# Patient Record
Sex: Male | Born: 1955 | Race: White | Hispanic: No | Marital: Married | Smoking: Never smoker
Health system: Southern US, Community
[De-identification: ages and names within clinical notes are randomized; demographics above are authoritative.]

## PROBLEM LIST (undated history)

## (undated) DIAGNOSIS — M19012 Primary osteoarthritis, left shoulder: Secondary | ICD-10-CM

## (undated) DIAGNOSIS — R972 Elevated prostate specific antigen [PSA]: Secondary | ICD-10-CM

## (undated) DIAGNOSIS — R112 Nausea with vomiting, unspecified: Secondary | ICD-10-CM

## (undated) DIAGNOSIS — Z9889 Other specified postprocedural states: Secondary | ICD-10-CM

## (undated) DIAGNOSIS — E785 Hyperlipidemia, unspecified: Secondary | ICD-10-CM

## (undated) DIAGNOSIS — C61 Malignant neoplasm of prostate: Secondary | ICD-10-CM

## (undated) DIAGNOSIS — N400 Enlarged prostate without lower urinary tract symptoms: Secondary | ICD-10-CM

## (undated) DIAGNOSIS — K219 Gastro-esophageal reflux disease without esophagitis: Secondary | ICD-10-CM

## (undated) DIAGNOSIS — M1990 Unspecified osteoarthritis, unspecified site: Secondary | ICD-10-CM

## (undated) HISTORY — PX: APPENDECTOMY: SHX54

## (undated) HISTORY — PX: HERNIA REPAIR: SHX51

## (undated) HISTORY — PX: COLONOSCOPY: SHX174

## (undated) HISTORY — PX: TONSILLECTOMY: SUR1361

---

## 2005-11-15 ENCOUNTER — Ambulatory Visit: Payer: Self-pay | Admitting: Physician Assistant

## 2005-12-14 ENCOUNTER — Ambulatory Visit: Payer: Self-pay | Admitting: Orthopaedic Surgery

## 2008-11-14 ENCOUNTER — Ambulatory Visit: Payer: Self-pay | Admitting: Internal Medicine

## 2009-05-04 ENCOUNTER — Ambulatory Visit: Payer: Self-pay | Admitting: Internal Medicine

## 2010-05-16 ENCOUNTER — Ambulatory Visit: Payer: Self-pay | Admitting: Family Medicine

## 2013-04-06 ENCOUNTER — Encounter (HOSPITAL_COMMUNITY): Payer: Self-pay | Admitting: Pharmacy Technician

## 2013-04-10 ENCOUNTER — Encounter (HOSPITAL_COMMUNITY): Payer: Self-pay

## 2013-04-10 ENCOUNTER — Other Ambulatory Visit (HOSPITAL_COMMUNITY): Payer: Self-pay | Admitting: *Deleted

## 2013-04-10 ENCOUNTER — Encounter (HOSPITAL_COMMUNITY)
Admission: RE | Admit: 2013-04-10 | Discharge: 2013-04-10 | Disposition: A | Discharge status: Home / Self Care | Payer: BC Managed Care – PPO | Source: Ambulatory Visit | Attending: Orthopedic Surgery | Admitting: Orthopedic Surgery

## 2013-04-10 DIAGNOSIS — Z01812 Encounter for preprocedural laboratory examination: Secondary | ICD-10-CM | POA: Insufficient documentation

## 2013-04-10 DIAGNOSIS — Z01818 Encounter for other preprocedural examination: Secondary | ICD-10-CM | POA: Insufficient documentation

## 2013-04-10 HISTORY — DX: Unspecified osteoarthritis, unspecified site: M19.90

## 2013-04-10 HISTORY — DX: Other specified postprocedural states: Z98.890

## 2013-04-10 HISTORY — DX: Other specified postprocedural states: R11.2

## 2013-04-10 LAB — ABO/RH: ABO/RH(D): O NEG

## 2013-04-10 LAB — BASIC METABOLIC PANEL
CO2: 27 mEq/L (ref 19–32)
Calcium: 9.5 mg/dL (ref 8.4–10.5)
GFR calc Af Amer: >90 mL/min (ref >90)
GFR calc non Af Amer: >90 mL/min (ref >90)
Glucose, Bld: 108 mg/dL — ABNORMAL HIGH (ref 70–99)
Potassium: 4.1 mEq/L (ref 3.5–5.1)
Sodium: 141 mEq/L (ref 135–145)

## 2013-04-10 LAB — CBC
Hemoglobin: 14.5 g/dL (ref 13.0–17.0)
MCH: 31.9 pg (ref 26.0–34.0)
MCHC: 34.9 g/dL (ref 30.0–36.0)
MCV: 91.4 fL (ref 78.0–100.0)
Platelets: 224 10*3/uL (ref 150–400)
RBC: 4.55 MIL/uL (ref 4.22–5.81)

## 2013-04-10 LAB — TYPE AND SCREEN
ABO/RH(D): O NEG
Antibody Screen: NEGATIVE

## 2013-04-10 NOTE — Progress Notes (Signed)
Left message at Dr Shelba Flake office that orders were needed for PAT visit for surgery.

## 2013-04-10 NOTE — Pre-Procedure Instructions (Signed)
Valdis Bevill  04/10/2013   Your procedure is scheduled on:  Tuesday, April 21, 2013 at 7:30 AM.   Report to [facility] Entrance "A" at 5:30 AM.   Call this number if you have problems the morning of surgery: [phone]   Remember:   Do not eat food or drink liquids after midnight Monday, 04/20/13.   Take these medicines the morning of surgery with A SIP OF WATER:  traMADol (ULTRAM) - if needed.  Stop all Vitamins and NSAIDS (Naproxen) as of 04/14/13.     Do not wear jewelry.  Do not wear lotions, powders, or cologne. You may wear deodorant.             Men may shave face and neck.  Do not bring valuables to the hospital.  [facility] is not responsible for any belongings or valuables.               Contacts, dentures or bridgework may not be worn into surgery.  Leave suitcase in the car. After surgery it may be brought to your room.  For patients admitted to the hospital, discharge time is determined by your treatment team.               Special Instructions: Shower using CHG 2 nights before surgery and the night before surgery.  If you shower the day of surgery use CHG.  Use special wash - you have one bottle of CHG for all showers.  You should use approximately 1/3 of the bottle for each shower.   Please read over the following fact sheets that you were given: Pain Booklet, Coughing and Deep Breathing, Blood Transfusion Information and Surgical Site Infection Prevention

## 2013-04-21 ENCOUNTER — Ambulatory Visit (HOSPITAL_COMMUNITY)
Admission: RE | Admit: 2013-04-21 | Discharge: 2013-04-21 | DRG: 483 | Disposition: A | Discharge status: Home / Self Care | Payer: BC Managed Care – PPO | Source: Ambulatory Visit | Attending: Orthopedic Surgery | Admitting: Orthopedic Surgery

## 2013-04-21 ENCOUNTER — Encounter (HOSPITAL_COMMUNITY): Payer: BC Managed Care – PPO | Admitting: Anesthesiology

## 2013-04-21 ENCOUNTER — Encounter (HOSPITAL_COMMUNITY): Payer: Self-pay | Admitting: *Deleted

## 2013-04-21 ENCOUNTER — Inpatient Hospital Stay (HOSPITAL_COMMUNITY): Payer: BC Managed Care – PPO

## 2013-04-21 ENCOUNTER — Encounter (HOSPITAL_COMMUNITY)
Admission: RE | Disposition: A | Discharge status: Home / Self Care | Payer: Self-pay | Source: Ambulatory Visit | Attending: Orthopedic Surgery

## 2013-04-21 ENCOUNTER — Inpatient Hospital Stay (HOSPITAL_COMMUNITY): Payer: BC Managed Care – PPO | Admitting: Anesthesiology

## 2013-04-21 DIAGNOSIS — M19219 Secondary osteoarthritis, unspecified shoulder: Secondary | ICD-10-CM | POA: Insufficient documentation

## 2013-04-21 DIAGNOSIS — M19012 Primary osteoarthritis, left shoulder: Secondary | ICD-10-CM

## 2013-04-21 DIAGNOSIS — M19011 Primary osteoarthritis, right shoulder: Secondary | ICD-10-CM | POA: Diagnosis present

## 2013-04-21 HISTORY — DX: Primary osteoarthritis, left shoulder: M19.012

## 2013-04-21 HISTORY — PX: SHOULDER HEMI-ARTHROPLASTY: SHX5049

## 2013-04-21 LAB — CBC
HCT: 40.9 % (ref 39.0–52.0)
Hemoglobin: 13.7 g/dL (ref 13.0–17.0)
MCHC: 33.5 g/dL (ref 30.0–36.0)
MCV: 91.9 fL (ref 78.0–100.0)
Platelets: 249 10*3/uL (ref 150–400)
WBC: 14.8 10*3/uL — ABNORMAL HIGH (ref 4.0–10.5)

## 2013-04-21 LAB — CREATININE, SERUM
Creatinine, Ser: 0.79 mg/dL (ref 0.50–1.35)
GFR calc Af Amer: >90 mL/min (ref >90)
GFR calc non Af Amer: >90 mL/min (ref >90)

## 2013-04-21 SURGERY — HEMIARTHROPLASTY, SHOULDER
Anesthesia: Regional | Site: Shoulder | Laterality: Left

## 2013-04-21 MED ORDER — ONDANSETRON HCL 4 MG PO TABS: 4.0000 mg | ORAL_TABLET | Freq: Four times a day (QID) | ORAL | Status: DC | PRN

## 2013-04-21 MED ORDER — ACETAMINOPHEN 650 MG RE SUPP: 650.0000 mg | Freq: Four times a day (QID) | RECTAL | Status: DC | PRN

## 2013-04-21 MED ORDER — METHOCARBAMOL 500 MG PO TABS: 500.0000 mg | ORAL_TABLET | Freq: Four times a day (QID) | ORAL | Status: DC

## 2013-04-21 MED ORDER — BUPIVACAINE HCL (PF) 0.25 % IJ SOLN
INTRAMUSCULAR | Status: AC
  Filled 2013-04-21: qty 30

## 2013-04-21 MED ORDER — MEPERIDINE HCL 25 MG/ML IJ SOLN: 6.2500 mg | INTRAMUSCULAR | Status: DC | PRN

## 2013-04-21 MED ORDER — ALUM & MAG HYDROXIDE-SIMETH 200-200-20 MG/5ML PO SUSP: 30.0000 mL | ORAL | Status: DC | PRN

## 2013-04-21 MED ORDER — HYDROMORPHONE HCL PF 1 MG/ML IJ SOLN: 0.5000 mg | INTRAMUSCULAR | Status: DC | PRN

## 2013-04-21 MED ORDER — DEXAMETHASONE SODIUM PHOSPHATE 4 MG/ML IJ SOLN
INTRAMUSCULAR | Status: DC | PRN
  Administered 2013-04-21: 8 mg via INTRAVENOUS

## 2013-04-21 MED ORDER — POTASSIUM CHLORIDE IN NACL 20-0.45 MEQ/L-% IV SOLN
INTRAVENOUS | Status: DC
  Administered 2013-04-21: 14:00:00 via INTRAVENOUS
  Filled 2013-04-21 (×2): qty 1000

## 2013-04-21 MED ORDER — ZOLPIDEM TARTRATE 5 MG PO TABS: 5.0000 mg | ORAL_TABLET | Freq: Every evening | ORAL | Status: DC | PRN

## 2013-04-21 MED ORDER — METHOCARBAMOL 500 MG PO TABS: 500.0000 mg | ORAL_TABLET | Freq: Four times a day (QID) | ORAL | Status: DC | PRN

## 2013-04-21 MED ORDER — POLYETHYLENE GLYCOL 3350 17 G PO PACK: 17.0000 g | PACK | Freq: Every day | ORAL | Status: DC | PRN

## 2013-04-21 MED ORDER — PROPOFOL 10 MG/ML IV BOLUS
INTRAVENOUS | Status: DC | PRN
  Administered 2013-04-21: 50 mg via INTRAVENOUS
  Administered 2013-04-21: 150 mg via INTRAVENOUS

## 2013-04-21 MED ORDER — DOCUSATE SODIUM 100 MG PO CAPS
100.0000 mg | ORAL_CAPSULE | Freq: Two times a day (BID) | ORAL | Status: DC
  Administered 2013-04-21: 100 mg via ORAL
  Filled 2013-04-21: qty 1

## 2013-04-21 MED ORDER — MENTHOL 3 MG MT LOZG
1.0000 | LOZENGE | OROMUCOSAL | Status: DC | PRN
  Filled 2013-04-21: qty 9

## 2013-04-21 MED ORDER — SENNA-DOCUSATE SODIUM 8.6-50 MG PO TABS: 2.0000 | ORAL_TABLET | Freq: Every day | ORAL | Status: DC

## 2013-04-21 MED ORDER — DIPHENHYDRAMINE HCL 12.5 MG/5ML PO ELIX: 12.5000 mg | ORAL_SOLUTION | ORAL | Status: DC | PRN

## 2013-04-21 MED ORDER — PHENOL 1.4 % MT LIQD
1.0000 | OROMUCOSAL | Status: DC | PRN
  Administered 2013-04-21: 1 via OROMUCOSAL
  Filled 2013-04-21: qty 177

## 2013-04-21 MED ORDER — HYDROMORPHONE HCL PF 1 MG/ML IJ SOLN: 0.2500 mg | INTRAMUSCULAR | Status: DC | PRN

## 2013-04-21 MED ORDER — ACETAMINOPHEN 325 MG PO TABS: 650.0000 mg | ORAL_TABLET | Freq: Four times a day (QID) | ORAL | Status: DC | PRN

## 2013-04-21 MED ORDER — LIDOCAINE HCL (CARDIAC) 20 MG/ML IV SOLN
INTRAVENOUS | Status: DC | PRN
  Administered 2013-04-21: 80 mg via INTRAVENOUS

## 2013-04-21 MED ORDER — PROMETHAZINE HCL 25 MG PO TABS: 25.0000 mg | ORAL_TABLET | Freq: Four times a day (QID) | ORAL | Status: DC | PRN

## 2013-04-21 MED ORDER — ONDANSETRON HCL 4 MG/2ML IJ SOLN
INTRAMUSCULAR | Status: DC | PRN
  Administered 2013-04-21: 4 mg via INTRAVENOUS

## 2013-04-21 MED ORDER — FENTANYL CITRATE 0.05 MG/ML IJ SOLN
INTRAMUSCULAR | Status: DC | PRN
  Administered 2013-04-21 (×2): 100 ug via INTRAVENOUS
  Administered 2013-04-21: 50 ug via INTRAVENOUS

## 2013-04-21 MED ORDER — CEFAZOLIN SODIUM-DEXTROSE 2-3 GM-% IV SOLR
2.0000 g | INTRAVENOUS | Status: AC
  Administered 2013-04-21: 2 g via INTRAVENOUS
  Filled 2013-04-21: qty 50

## 2013-04-21 MED ORDER — PHENYLEPHRINE HCL 10 MG/ML IJ SOLN
10.0000 mg | INTRAVENOUS | Status: DC | PRN
  Administered 2013-04-21: 25 ug/min via INTRAVENOUS

## 2013-04-21 MED ORDER — ONDANSETRON HCL 4 MG/2ML IJ SOLN: 4.0000 mg | Freq: Once | INTRAMUSCULAR | Status: DC | PRN

## 2013-04-21 MED ORDER — ENOXAPARIN SODIUM 40 MG/0.4ML ~~LOC~~ SOLN
40.0000 mg | SUBCUTANEOUS | Status: DC
  Filled 2013-04-21: qty 0.4

## 2013-04-21 MED ORDER — VITAMIN E 45 MG (100 UNIT) PO CAPS: 200.0000 [IU] | ORAL_CAPSULE | Freq: Every day | ORAL | Status: DC

## 2013-04-21 MED ORDER — GLYCOPYRROLATE 0.2 MG/ML IJ SOLN
INTRAMUSCULAR | Status: DC | PRN
  Administered 2013-04-21: 0.4 mg via INTRAVENOUS

## 2013-04-21 MED ORDER — KETOCONAZOLE 2 % EX CREA: 1.0000 "application " | TOPICAL_CREAM | Freq: Two times a day (BID) | CUTANEOUS | Status: DC | PRN

## 2013-04-21 MED ORDER — SIMVASTATIN 20 MG PO TABS
20.0000 mg | ORAL_TABLET | Freq: Every day | ORAL | Status: DC
  Filled 2013-04-21: qty 1

## 2013-04-21 MED ORDER — TAMSULOSIN HCL 0.4 MG PO CAPS
0.4000 mg | ORAL_CAPSULE | Freq: Every day | ORAL | Status: DC
  Administered 2013-04-21 (×2): 0.4 mg via ORAL
  Filled 2013-04-21: qty 1

## 2013-04-21 MED ORDER — ARTIFICIAL TEARS OP OINT
TOPICAL_OINTMENT | OPHTHALMIC | Status: DC | PRN
  Administered 2013-04-21: 1 via OPHTHALMIC

## 2013-04-21 MED ORDER — METOCLOPRAMIDE HCL 5 MG/ML IJ SOLN: 5.0000 mg | Freq: Three times a day (TID) | INTRAMUSCULAR | Status: DC | PRN

## 2013-04-21 MED ORDER — LACTATED RINGERS IV SOLN
INTRAVENOUS | Status: DC | PRN
  Administered 2013-04-21 (×2): via INTRAVENOUS

## 2013-04-21 MED ORDER — METHOCARBAMOL 100 MG/ML IJ SOLN
500.0000 mg | Freq: Four times a day (QID) | INTRAVENOUS | Status: DC | PRN
  Filled 2013-04-21: qty 5

## 2013-04-21 MED ORDER — BISACODYL 10 MG RE SUPP: 10.0000 mg | Freq: Every day | RECTAL | Status: DC | PRN

## 2013-04-21 MED ORDER — OXYCODONE HCL 5 MG PO TABS: 5.0000 mg | ORAL_TABLET | Freq: Once | ORAL | Status: DC | PRN

## 2013-04-21 MED ORDER — CEFAZOLIN SODIUM-DEXTROSE 2-3 GM-% IV SOLR
2.0000 g | Freq: Four times a day (QID) | INTRAVENOUS | Status: DC
  Administered 2013-04-21: 2 g via INTRAVENOUS
  Filled 2013-04-21 (×3): qty 50

## 2013-04-21 MED ORDER — OXYCODONE-ACETAMINOPHEN 10-325 MG PO TABS: 1.0000 | ORAL_TABLET | Freq: Four times a day (QID) | ORAL | Status: DC | PRN

## 2013-04-21 MED ORDER — OXYCODONE HCL 5 MG PO TABS: 5.0000 mg | ORAL_TABLET | ORAL | Status: DC | PRN

## 2013-04-21 MED ORDER — SENNA 8.6 MG PO TABS
1.0000 | ORAL_TABLET | Freq: Two times a day (BID) | ORAL | Status: DC
  Administered 2013-04-21: 8.6 mg via ORAL
  Filled 2013-04-21 (×2): qty 1

## 2013-04-21 MED ORDER — BUPIVACAINE-EPINEPHRINE PF 0.5-1:200000 % IJ SOLN
INTRAMUSCULAR | Status: DC | PRN
  Administered 2013-04-21: 30 mL via PERINEURAL

## 2013-04-21 MED ORDER — OXYCODONE HCL 5 MG/5ML PO SOLN: 5.0000 mg | Freq: Once | ORAL | Status: DC | PRN

## 2013-04-21 MED ORDER — METOCLOPRAMIDE HCL 10 MG PO TABS: 5.0000 mg | ORAL_TABLET | Freq: Three times a day (TID) | ORAL | Status: DC | PRN

## 2013-04-21 MED ORDER — NEOSTIGMINE METHYLSULFATE 1 MG/ML IJ SOLN
INTRAMUSCULAR | Status: DC | PRN
  Administered 2013-04-21: 3 mg via INTRAVENOUS

## 2013-04-21 MED ORDER — TRAMADOL HCL 50 MG PO TABS: 50.0000 mg | ORAL_TABLET | Freq: Every day | ORAL | Status: DC | PRN

## 2013-04-21 MED ORDER — ROCURONIUM BROMIDE 100 MG/10ML IV SOLN
INTRAVENOUS | Status: DC | PRN
  Administered 2013-04-21: 20 mg via INTRAVENOUS
  Administered 2013-04-21: 50 mg via INTRAVENOUS

## 2013-04-21 MED ORDER — SODIUM CHLORIDE 0.9 % IR SOLN
Status: DC | PRN
  Administered 2013-04-21: 1000 mL

## 2013-04-21 MED ORDER — ONDANSETRON HCL 4 MG/2ML IJ SOLN: 4.0000 mg | Freq: Four times a day (QID) | INTRAMUSCULAR | Status: DC | PRN

## 2013-04-21 MED ORDER — OXYCODONE-ACETAMINOPHEN 5-325 MG PO TABS: 1.0000 | ORAL_TABLET | ORAL | Status: DC | PRN

## 2013-04-21 MED ORDER — MIDAZOLAM HCL 5 MG/5ML IJ SOLN
INTRAMUSCULAR | Status: DC | PRN
  Administered 2013-04-21: 2 mg via INTRAVENOUS

## 2013-04-21 SURGICAL SUPPLY — 72 items
BENZOIN TINCTURE PRP APPL 2/3 (GAUZE/BANDAGES/DRESSINGS) ×3 IMPLANT
BIT DRILL QUICK RELEASE PRPHRL (DRILL) ×6 IMPLANT
BLADE SAW SGTL MED 73X18.5 STR (BLADE) ×3 IMPLANT
BOOTCOVER CLEANROOM LRG (PROTECTIVE WEAR) ×12 IMPLANT
BOWL SMART MIX CTS (DISPOSABLE) IMPLANT
BRUSH FEMORAL CANAL (MISCELLANEOUS) IMPLANT
CAP HEMI-ARHROPLASTY SHOULDER ×3 IMPLANT
CLOTH BEACON ORANGE TIMEOUT ST (SAFETY) IMPLANT
COVER SURGICAL LIGHT HANDLE (MISCELLANEOUS) ×3 IMPLANT
COVER TABLE BACK 60X90 (DRAPES) IMPLANT
DRAPE C-ARM 42X72 X-RAY (DRAPES) IMPLANT
DRAPE INCISE IOBAN 66X45 STRL (DRAPES) ×3 IMPLANT
DRAPE U-SHAPE 47X51 STRL (DRAPES) ×3 IMPLANT
DRILL QUICK RELEASE PERIPHERAL (DRILL) ×9
DRSG MEPILEX BORDER 4X8 (GAUZE/BANDAGES/DRESSINGS) ×3 IMPLANT
DRSG PAD ABDOMINAL 8X10 ST (GAUZE/BANDAGES/DRESSINGS) IMPLANT
DURAPREP 26ML APPLICATOR (WOUND CARE) ×3 IMPLANT
ELECT BLADE 6.5 EXT (BLADE) IMPLANT
ELECT NEEDLE TIP 2.8 STRL (NEEDLE) IMPLANT
ELECT REM PT RETURN 9FT ADLT (ELECTROSURGICAL) ×3
ELECTRODE REM PT RTRN 9FT ADLT (ELECTROSURGICAL) ×2 IMPLANT
EVACUATOR 1/8 PVC DRAIN (DRAIN) IMPLANT
FACESHIELD LNG OPTICON STERILE (SAFETY) ×3 IMPLANT
GLOVE BIOGEL PI IND STRL 6.5 (GLOVE) ×2 IMPLANT
GLOVE BIOGEL PI IND STRL 8 (GLOVE) ×4 IMPLANT
GLOVE BIOGEL PI INDICATOR 6.5 (GLOVE) ×1
GLOVE BIOGEL PI INDICATOR 8 (GLOVE) ×2
GLOVE BIOGEL PI ORTHO PRO SZ7 (GLOVE) ×1
GLOVE BIOGEL PI ORTHO PRO SZ8 (GLOVE) ×2
GLOVE ORTHO TXT STRL SZ7.5 (GLOVE) ×3 IMPLANT
GLOVE PI ORTHO PRO STRL SZ7 (GLOVE) ×2 IMPLANT
GLOVE PI ORTHO PRO STRL SZ8 (GLOVE) ×4 IMPLANT
GLOVE SURG ORTHO 8.0 STRL STRW (GLOVE) ×6 IMPLANT
GLOVE SURG SS PI 7.0 STRL IVOR (GLOVE) ×3 IMPLANT
GLOVE SURG SS PI 8.0 STRL IVOR (GLOVE) ×6 IMPLANT
GOWN BRE IMP PREV XXLGXLNG (GOWN DISPOSABLE) ×9 IMPLANT
GOWN STRL NON-REIN LRG LVL3 (GOWN DISPOSABLE) ×3 IMPLANT
GOWN STRL REIN XL XLG (GOWN DISPOSABLE) ×3 IMPLANT
HANDPIECE INTERPULSE COAX TIP (DISPOSABLE)
HOOD PEEL AWAY FACE SHEILD DIS (HOOD) ×6 IMPLANT
KIT BASIN OR (CUSTOM PROCEDURE TRAY) ×3 IMPLANT
KIT ROOM TURNOVER OR (KITS) ×3 IMPLANT
MANIFOLD NEPTUNE II (INSTRUMENTS) ×3 IMPLANT
NEEDLE 1/2 CIR CATGUT .05X1.09 (NEEDLE) IMPLANT
NEEDLE HYPO 25GX1X1/2 BEV (NEEDLE) IMPLANT
NS IRRIG 1000ML POUR BTL (IV SOLUTION) ×3 IMPLANT
PACK SHOULDER (CUSTOM PROCEDURE TRAY) ×3 IMPLANT
PAD ARMBOARD 7.5X6 YLW CONV (MISCELLANEOUS) ×6 IMPLANT
PIN STEINMANN THREADED TIP (PIN) ×3 IMPLANT
SET HNDPC FAN SPRY TIP SCT (DISPOSABLE) IMPLANT
SLING ARM IMMOBILIZER LRG (SOFTGOODS) ×3 IMPLANT
SLING ARM IMMOBILIZER MED (SOFTGOODS) IMPLANT
SMARTMIX MINI TOWER (MISCELLANEOUS)
SPONGE GAUZE 4X4 12PLY (GAUZE/BANDAGES/DRESSINGS) IMPLANT
SPONGE LAP 18X18 X RAY DECT (DISPOSABLE) ×3 IMPLANT
STRIP CLOSURE SKIN 1/2X4 (GAUZE/BANDAGES/DRESSINGS) ×3 IMPLANT
SUCTION FRAZIER TIP 10 FR DISP (SUCTIONS) ×3 IMPLANT
SUPPORT WRAP ARM LG (MISCELLANEOUS) ×3 IMPLANT
SUT FIBERWIRE #2 38 REV NDL BL (SUTURE) ×15
SUT MNCRL AB 4-0 PS2 18 (SUTURE) IMPLANT
SUT VIC AB 0 CT1 27 (SUTURE) ×1
SUT VIC AB 0 CT1 27XBRD ANBCTR (SUTURE) ×2 IMPLANT
SUT VIC AB 2-0 CT1 27 (SUTURE)
SUT VIC AB 2-0 CT1 TAPERPNT 27 (SUTURE) IMPLANT
SUT VIC AB 3-0 SH 18 (SUTURE) ×3 IMPLANT
SUTURE FIBERWR#2 38 REV NDL BL (SUTURE) ×10 IMPLANT
SYR CONTROL 10ML LL (SYRINGE) IMPLANT
TOWEL OR 17X24 6PK STRL BLUE (TOWEL DISPOSABLE) ×3 IMPLANT
TOWEL OR 17X26 10 PK STRL BLUE (TOWEL DISPOSABLE) ×3 IMPLANT
TOWER SMARTMIX MINI (MISCELLANEOUS) IMPLANT
TRAY FOLEY CATH 16FRSI W/METER (SET/KITS/TRAYS/PACK) ×3 IMPLANT
WATER STERILE IRR 1000ML POUR (IV SOLUTION) IMPLANT

## 2013-04-21 NOTE — Transfer of Care (Signed)
Immediate Anesthesia Transfer of Care Note  Patient: Zachary Marshall  Procedure(s) Performed: Procedure(s): SHOULDER HEMI-ARTHROPLASTY, left (Left)  Patient Location: PACU  Anesthesia Type:General and GA combined with regional for post-op pain  Level of Consciousness: awake, alert , oriented and patient cooperative  Airway & Oxygen Therapy: Patient Spontanous Breathing and Patient connected to nasal cannula oxygen  Post-op Assessment: Report given to PACU RN and Post -op Vital signs reviewed and stable  Post vital signs: Reviewed and stable  Complications: No apparent anesthesia complications

## 2013-04-21 NOTE — Discharge Summary (Signed)
Physician Discharge Summary  Patient ID: Zachary Marshall MRN: [id number] DOB/AGE: 58/07/1955 58 y.o.  Admit date: 04/21/2013 Discharge date: 04/21/2013  Admission Diagnoses:  Osteoarthritis of left shoulder  Discharge Diagnoses:  Principal Problem:   Osteoarthritis of left shoulder   Past Medical History  Diagnosis Date  . PONV (postoperative nausea and vomiting)   . Arthritis   . Osteoarthritis of left shoulder 04/21/2013    Surgeries: Procedure(s): SHOULDER HEMI-ARTHROPLASTY, left on 04/21/2013   Consultants (if any):    Discharged Condition: Improved  Hospital Course: Zachary Marshall is an 57 y.o. male who was admitted 04/21/2013 with a diagnosis of Osteoarthritis of left shoulder and went to the operating room on 04/21/2013 and underwent the above named procedures.    He was given perioperative antibiotics:  Anti-infectives   Start     Dose/Rate Route Frequency Ordered Stop   04/21/13 1400  ceFAZolin (ANCEF) IVPB 2 g/50 mL premix     2 g 100 mL/hr over 30 Minutes Intravenous Every 6 hours 04/21/13 1142 04/22/13 0759   04/21/13 0800  ceFAZolin (ANCEF) IVPB 2 g/50 mL premix     2 g 100 mL/hr over 30 Minutes Intravenous To Surgery 04/21/13 0750 04/21/13 0758    .  He was given sequential compression devices, early ambulation, for DVT prophylaxis.  He benefited maximally from the hospital stay and there were no complications.    Recent vital signs:  Filed Vitals:   04/21/13 1140  BP: 121/74  Pulse: 68  Temp: 97.6 F (36.4 C)  Resp: 16    Recent laboratory studies:  Lab Results  Component Value Date   HGB 13.7 04/21/2013   HGB 14.5 04/10/2013   Lab Results  Component Value Date   WBC 14.8* 04/21/2013   PLT 249 04/21/2013   No results found for this basename: INR   Lab Results  Component Value Date   NA 141 04/10/2013   K 4.1 04/10/2013   CL 103 04/10/2013   CO2 27 04/10/2013   BUN 12 04/10/2013   CREATININE 0.79 04/21/2013   GLUCOSE 108* 04/10/2013     Discharge Medications:     Medication List         ketoconazole 2 % cream  Commonly known as:  NIZORAL  Apply 1 application topically 2 (two) times daily as needed for irritation (or dry skin).     methocarbamol 500 MG tablet  Commonly known as:  ROBAXIN  Take 1 tablet (500 mg total) by mouth 4 (four) times daily.     multivitamin with minerals Tabs tablet  Take 1 tablet by mouth daily.     naproxen 500 MG tablet  Commonly known as:  NAPROSYN  Take 500 mg by mouth daily as needed (pain).     oxyCODONE-acetaminophen 10-325 MG per tablet  Commonly known as:  PERCOCET  Take 1-2 tablets by mouth every 6 (six) hours as needed for pain. MAXIMUM TOTAL ACETAMINOPHEN DOSE IS 4000 MG PER DAY     promethazine 25 MG tablet  Commonly known as:  PHENERGAN  Take 1 tablet (25 mg total) by mouth every 6 (six) hours as needed for nausea or vomiting.     sennosides-docusate sodium 8.6-50 MG tablet  Commonly known as:  SENOKOT-S  Take 2 tablets by mouth daily.     simvastatin 20 MG tablet  Commonly known as:  ZOCOR  Take 20 mg by mouth daily.     tamsulosin 0.4 MG Caps capsule  Commonly known as:  FLOMAX  Take 0.4 mg by mouth daily.     traMADol 50 MG tablet  Commonly known as:  ULTRAM  Take 50 mg by mouth daily as needed (arthritic pain).     VITAMIN B COMPLEX PO  Take 1 tablet by mouth daily.     VITAMIN C PO  Take 1 tablet by mouth daily.     vitamin E 100 UNIT capsule  Take 200 Units by mouth daily.        Diagnostic Studies: Dg Shoulder Left Port  04/21/2013   CLINICAL DATA:  Postoperative evaluation left shoulder.  EXAM: PORTABLE LEFT SHOULDER - 2+ VIEW  COMPARISON:  None.  FINDINGS: Patient status post left shoulder hemiarthroplasty. No definite large fracture. Hardware appears in appropriate position on single AP radiograph. Small amount of subcutaneous gas.  IMPRESSION: Status post left shoulder hemiarthroplasty.   Electronically Signed   By: Annia Belt M.D.   On:  04/21/2013 14:00    Disposition: Final discharge disposition not confirmed      Discharge Orders   Future Orders Complete By Expires   Call MD / Call 911  As directed    Comments:     If you experience chest pain or shortness of breath, CALL 911 and be transported to the hospital emergency room.  If you develope a fever above 101 F, pus (white drainage) or increased drainage or redness at the wound, or calf pain, call your surgeon's office.   Constipation Prevention  As directed    Comments:     Drink plenty of fluids.  Prune juice may be helpful.  You may use a stool softener, such as Colace (over the counter) 100 mg twice a day.  Use MiraLax (over the counter) for constipation as needed.   Diet general  As directed    Discharge instructions  As directed    Comments:     Change dressing in 3 days and reapply fresh dressing, unless you have a splint (half cast).  If you have a splint/cast, just leave in place until your follow-up appointment.    Keep wounds dry for 3 weeks.  Leave steri-strips in place on skin.  Do not apply lotion or anything to the wound.   OT splint  As directed 04/21/2014   Comments:     Ok for elbow, wrist, and hand motion.  Sling at all times except for hygiene, no shoulder activity until 6 weeks.      Follow-up Information   Follow up with Eulas Post, MD. Schedule an appointment as soon as possible for a visit in 2 weeks.   Specialty:  Orthopedic Surgery   Contact information:   [address] [place] [address] [phone]        Signed: Eulas Post 04/21/2013, 3:59 PM

## 2013-04-21 NOTE — Anesthesia Preprocedure Evaluation (Addendum)
Anesthesia Evaluation  Patient identified by MRN, date of birth, ID band Patient awake    Reviewed: Allergy & Precautions, H&P , NPO status , Patient's Chart, lab work & pertinent test results  History of Anesthesia Complications (+) PONV and history of anesthetic complications  Airway Mallampati: II TM Distance: >3 FB Neck ROM: Full    Dental  (+) Teeth Intact and Dental Advisory Given   Pulmonary neg pulmonary ROS,    Pulmonary exam normal       Cardiovascular Exercise Tolerance: Good Rhythm:Regular Rate:Normal     Neuro/Psych negative neurological ROS  negative psych ROS   GI/Hepatic negative GI ROS, Neg liver ROS,   Endo/Other  negative endocrine ROS  Renal/GU negative Renal ROS  negative genitourinary   Musculoskeletal negative musculoskeletal ROS (+) Arthritis -, Osteoarthritis,    Abdominal   Peds  Hematology negative hematology ROS (+)   Anesthesia Other Findings   Reproductive/Obstetrics                          Anesthesia Physical Anesthesia Plan  ASA: II  Anesthesia Plan: General   Post-op Pain Management:    Induction: Intravenous  Airway Management Planned: Oral ETT  Additional Equipment:   Intra-op Plan:   Post-operative Plan: Extubation in OR  Informed Consent: I have reviewed the patients History and Physical, chart, labs and discussed the procedure including the risks, benefits and alternatives for the proposed anesthesia with the patient or authorized representative who has indicated his/her understanding and acceptance.   Dental advisory given  Plan Discussed with: CRNA and Surgeon  Anesthesia Plan Comments:        Anesthesia Quick Evaluation

## 2013-04-21 NOTE — Anesthesia Procedure Notes (Addendum)
Anesthesia Regional Block:  Interscalene brachial plexus block  Pre-Anesthetic Checklist: ,, timeout performed, Correct Patient, Correct Site, Correct Laterality, Correct Procedure, Correct Position, site marked, Risks and benefits discussed,  Surgical consent,  Pre-op evaluation,  At surgeon's request and post-op pain management  Laterality: Left  Prep: chloraprep       Needles:  Injection technique: Single-shot  Needle Type: Echogenic Stimulator Needle     Needle Length: 9cm  Needle Gauge: 21 and 21 G    Additional Needles:  Procedures: ultrasound guided (picture in chart) and nerve stimulator Interscalene brachial plexus block  Nerve Stimulator or Paresthesia:  Response: 0.4 mA,   Additional Responses:   Narrative:  Start time: 04/21/2013 7:10 AM End time: 04/21/2013 7:20 AM Injection made incrementally with aspirations every 5 mL.  Performed by: Personally  Anesthesiologist: Arta Bruce MD  Additional Notes: Monitors applied. Patient sedated. Sterile prep and drape,hand hygiene and sterile gloves were used. Relevant anatomy identified.Needle position confirmed.Local anesthetic injected incrementally after negative aspiration. Local anesthetic spread visualized around nerve(s). Vascular puncture avoided. No complications. Image printed for medical record.The patient tolerated the procedure well.        Procedure Name: Intubation Date/Time: 04/21/2013 7:42 AM Performed by: Tyrone Nine Pre-anesthesia Checklist: Patient identified, Timeout performed, Emergency Drugs available, Suction available and Patient being monitored Patient Re-evaluated:Patient Re-evaluated prior to inductionOxygen Delivery Method: Circle system utilized Preoxygenation: Pre-oxygenation with 100% oxygen Intubation Type: IV induction Ventilation: Mask ventilation without difficulty Laryngoscope Size: Mac and 3 (Arytenoids only viewed) Grade View: Grade III Tube type: Oral Tube size: 8.0  mm Number of attempts: 1 Airway Equipment and Method: Stylet Placement Confirmation: ETT inserted through vocal cords under direct vision,  breath sounds checked- equal and bilateral and positive ETCO2 Secured at: 23 cm Tube secured with: Tape Dental Injury: Teeth and Oropharynx as per pre-operative assessment

## 2013-04-21 NOTE — Anesthesia Postprocedure Evaluation (Signed)
Anesthesia Post Note  Patient: Zachary Marshall  Procedure(s) Performed: Procedure(s) (LRB): SHOULDER HEMI-ARTHROPLASTY, left (Left)  Anesthesia type: general  Patient location: PACU  Post pain: Pain level controlled  Post assessment: Patient's Cardiovascular Status Stable  Last Vitals:  Filed Vitals:   04/21/13 1115  BP:   Pulse: 60  Temp: 36.2 C  Resp: 16    Post vital signs: Reviewed and stable  Level of consciousness: sedated  Complications: No apparent anesthesia complications

## 2013-04-21 NOTE — Progress Notes (Signed)
UR review completed. 

## 2013-04-21 NOTE — Op Note (Signed)
04/21/2013  9:49 AM  PATIENT:  Zachary Marshall    PRE-OPERATIVE DIAGNOSIS:  Left shoulder posttraumatic osteoarthritis  POST-OPERATIVE DIAGNOSIS:  Same  PROCEDURE:  SHOULDER HEMI-ARTHROPLASTY, left  SURGEON:  Eulas Post, MD  PHYSICIAN ASSISTANT: Janace Litten, OPA-C, present and scrubbed throughout the case, critical for completion in a timely fashion, and for retraction, instrumentation, and closure.  ANESTHESIA:   General  PREOPERATIVE INDICATIONS:  Zachary Marshall is a  57 y.o. male who has had a prior history of multiple dislocations when he was young, and had progressive left shoulder stiffness, end-stage osteoarthritis on radiographs with hypertrophic osteophyte formation, failed injections, activity modification, anti-inflammatories, among others, and elected for surgical management.    The risks benefits and alternatives were discussed with the patient preoperatively including but not limited to the risks of infection, bleeding, nerve injury, cardiopulmonary complications, the need for revision surgery, dislocation, loosening, incomplete relief of pain, among others, and the patient was willing to proceed.   OPERATIVE IMPLANTS: Biomet size 12 mini press-fit humeral stem, size 50x24 Versa-dial humeral head, set in the C position with increased coverage posteriorly  OPERATIVE FINDINGS: Advanced glenohumeral osteoarthritis involving the glenoid and the humeral head with substantial osteophyte formation inferiorly. The glenoid had fairly substantial posterior wear, and was effectively retroverted, and did not have adequate bone stock for glenoid implantation.   OPERATIVE PROCEDURE: The patient was brought to the operating room and placed in the supine position. General anesthesia was administered. IV antibiotics were given.  A foley was placed. The upper extremity was prepped and draped in usual sterile fashion. The patient was in a beachchair position with all bony prominences padded.    Time out was performed and a deltopectoral approach was carried out. The biceps tendon was tenodesed to the pectoralis tendon. The subscapularis was released, tagging it with a #2 FiberWire, leaving a cuff of tendon for repair.   The inferior osteophyte was removed using an osteotome, and release of the capsule off of the humeral side was completed. There was substantial osteophyte, almost the size of the humeral head itself, inferiorly, and posteriorly, which was removed with an osteotome and a rongeur. The head was dislocated, and I reamed sequentially. I placed the humeral cutting guide at 30 of retroversion, and then pinned this into place, and made my humeral neck cut. This was at the appropriate level.   I then placed deep retractors and exposed the glenoid. There was a large loose body which was removed. I closely examined the glenoid, and the orientation was somewhat posterior, and the face is extremely flat and wide, and approximately at the level of the coracoid, if not more medial and I did not feel that I had adequate bone stock to achieve successful reconstruction of the glenoid.  Therefore I went back to the humerus, I sequentially broached, up to the selected size, with the broach set at 30 of retroversion. I then placed the real stem. I trialed with multiple heads, and the above-named component was selected. Increased posterior coverage improved the coverage. The soft tissue tension was appropriate.   I then impacted the real humeral head into place, reduced the head, and irrigated copiously. The humeral head was oriented directly to the glenoid with the arm in neutral rotation. The posterior glenoid was somewhat deficient, and so when the arm was in internal rotation it was aiming towards that deficiency, however the head was stable, and did not dislocate posteriorly.  I repaired the subscapularis with 4 #2 FiberWire, as  well as the rotator interval, and irrigated copiously once more.  The subcutaneous tissue was closed with Vicryl including the deltopectoral fascia.   The skin was closed with Steri-Strips and sterile gauze was applied. He had a preoperative nerve block. He tolerated the procedure well and there were no complications.

## 2013-04-21 NOTE — H&P (Signed)
PREOPERATIVE H&P  Chief Complaint: OA LEFT SHOULDER  HPI: Zachary Marshall is a 57 y.o. male who presents for preoperative history and physical with a diagnosis of OA LEFT SHOULDER. Symptoms are rated as moderate to severe, and have been worsening.  This is significantly impairing activities of daily living.  He has elected for surgical management. He has failed, nsaids, activity mod, injections, exercises.  Past Medical History  Diagnosis Date  . PONV (postoperative nausea and vomiting)   . Arthritis    Past Surgical History  Procedure Laterality Date  . Appendectomy    . Hernia repair Right    History   Social History  . Marital Status: Married    Spouse Name: N/A    Number of Children: N/A  . Years of Education: N/A   Social History Main Topics  . Smoking status: Never Smoker   . Smokeless tobacco: Never Used  . Alcohol Use: Yes     Comment: ocasional beer  . Drug Use: No  . Sexual Activity: None   Other Topics Concern  . None   Social History Narrative  . None   History reviewed. No pertinent family history. No Known Allergies Prior to Admission medications   Medication Sig Start Date End Date Taking? Authorizing Provider  Ascorbic Acid (VITAMIN C PO) Take 1 tablet by mouth daily.   Yes Historical Provider, MD  B Complex Vitamins (VITAMIN B COMPLEX PO) Take 1 tablet by mouth daily.   Yes Historical Provider, MD  ketoconazole (NIZORAL) 2 % cream Apply 1 application topically 2 (two) times daily as needed for irritation (or dry skin).   Yes Historical Provider, MD  Multiple Vitamin (MULTIVITAMIN WITH MINERALS) TABS tablet Take 1 tablet by mouth daily.   Yes Historical Provider, MD  naproxen (NAPROSYN) 500 MG tablet Take 500 mg by mouth daily as needed (pain).   Yes Historical Provider, MD  oxyCODONE-acetaminophen (PERCOCET) 10-325 MG per tablet Take 1 tablet by mouth at bedtime as needed for pain.   Yes Historical Provider, MD  simvastatin (ZOCOR) 20 MG tablet Take 20  mg by mouth daily.   Yes Historical Provider, MD  tamsulosin (FLOMAX) 0.4 MG CAPS capsule Take 0.4 mg by mouth daily.   Yes Historical Provider, MD  traMADol (ULTRAM) 50 MG tablet Take 50 mg by mouth daily as needed (arthritic pain).   Yes Historical Provider, MD  vitamin E 100 UNIT capsule Take 200 Units by mouth daily.   Yes Historical Provider, MD     Positive ROS: All other systems have been reviewed and were otherwise negative with the exception of those mentioned in the HPI and as above.  Physical Exam: General: Alert, no acute distress Cardiovascular: No pedal edema Respiratory: No cyanosis, no use of accessory musculature GI: No organomegaly, abdomen is soft and non-tender Skin: No lesions in the area of chief complaint Neurologic: Sensation intact distally Psychiatric: Patient is competent for consent with normal mood and affect Lymphatic: No axillary or cervical lymphadenopathy  MUSCULOSKELETAL: 0-90 degrees, ER to -10, cuff strength fair  XR with severe end stage OA post traumatic  Assessment: OA LEFT SHOULDER, post traumatic  Plan: Plan for Procedure(s): TOTAL SHOULDER ARTHROPLASTY  The risks benefits and alternatives were discussed with the patient including but not limited to the risks of nonoperative treatment, versus surgical intervention including infection, bleeding, nerve injury,  blood clots, cardiopulmonary complications, morbidity, mortality, among others, and they were willing to proceed.   Eulas Post, MD Cell [phone]  04/21/2013 7:21 AM

## 2013-04-21 NOTE — Preoperative (Signed)
Beta Blockers   Reason not to administer Beta Blockers:Not Applicable 

## 2013-04-21 NOTE — Progress Notes (Signed)
Orthopedic Tech Progress Note Patient Details:  Gatlyn Lipari [birth year] [id number]  Patient ID: Thressa Sheller, male   DOB: 28-Sep-1955, 57 y.o.   MRN: [id number] Trapeze bar patient helper  Nikki Dom 04/21/2013, 2:15 PM

## 2013-04-22 ENCOUNTER — Encounter (HOSPITAL_COMMUNITY): Payer: Self-pay | Admitting: Orthopedic Surgery

## 2013-07-22 ENCOUNTER — Ambulatory Visit: Payer: Self-pay | Admitting: Family Medicine

## 2013-07-22 LAB — URIC ACID: Uric Acid: 4.3 mg/dL (ref 3.5–7.2)

## 2013-10-14 ENCOUNTER — Encounter: Payer: Self-pay | Admitting: Orthopedic Surgery

## 2013-10-19 ENCOUNTER — Encounter: Payer: Self-pay | Admitting: Orthopedic Surgery

## 2013-11-18 ENCOUNTER — Encounter: Payer: Self-pay | Admitting: Orthopedic Surgery

## 2013-12-19 ENCOUNTER — Encounter: Payer: Self-pay | Admitting: Orthopedic Surgery

## 2014-01-19 ENCOUNTER — Encounter: Payer: Self-pay | Admitting: Orthopedic Surgery

## 2014-02-18 ENCOUNTER — Encounter: Payer: Self-pay | Admitting: Orthopedic Surgery

## 2017-07-11 ENCOUNTER — Other Ambulatory Visit: Payer: Self-pay | Admitting: Family Medicine

## 2017-07-11 DIAGNOSIS — G4452 New daily persistent headache (NDPH): Secondary | ICD-10-CM

## 2017-07-23 ENCOUNTER — Ambulatory Visit: Admission: RE | Admit: 2017-07-23 | Payer: BLUE CROSS/BLUE SHIELD | Source: Ambulatory Visit

## 2017-09-23 ENCOUNTER — Other Ambulatory Visit: Payer: Self-pay

## 2017-09-23 ENCOUNTER — Ambulatory Visit
Admission: EM | Admit: 2017-09-23 | Discharge: 2017-09-23 | Disposition: A | Discharge status: Home / Self Care | Payer: BLUE CROSS/BLUE SHIELD | Attending: Family Medicine | Admitting: Family Medicine

## 2017-09-23 DIAGNOSIS — J069 Acute upper respiratory infection, unspecified: Secondary | ICD-10-CM | POA: Diagnosis not present

## 2017-09-23 MED ORDER — FLUTICASONE PROPIONATE 50 MCG/ACT NA SUSP: 2.0000 | Freq: Every day | NASAL | 0 refills | Status: DC

## 2017-09-23 MED ORDER — AZITHROMYCIN 250 MG PO TABS: 250.0000 mg | ORAL_TABLET | Freq: Every day | ORAL | 0 refills | Status: DC

## 2017-09-23 MED ORDER — HYDROCOD POLST-CPM POLST ER 10-8 MG/5ML PO SUER: 5.0000 mL | Freq: Two times a day (BID) | ORAL | 0 refills | Status: DC

## 2017-09-23 NOTE — ED Provider Notes (Signed)
[facility]    CSN: [id number] Arrival date & time: 09/23/17  1709     History   Chief Complaint Chief Complaint  Patient presents with  . Cough    HPI Zachary Marshall is a 62 y.o. male.   HPI  62 year old male presents with 6 days history of cough and congestion.  He states today it worsened  With body aches headache nasal congestion.  His cough has been productive of dark mucus.  He did a tele- visit last week  was given Tessalon Perles and inhaler but they have not helped.  Concern to him is how much worse he felt today.        Past Medical History:  Diagnosis Date  . Arthritis   . 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 PONV (postoperative nausea and vomiting)     Patient Active Problem List   Diagnosis Date Noted  . 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 APPENDECTOMY    . HERNIA REPAIR Right   . SHOULDER HEMI-ARTHROPLASTY Left 04/21/2013   Procedure: SHOULDER HEMI-ARTHROPLASTY, left;  Surgeon: Johnny Bridge, MD;  Location: [place];  Service: Orthopedics;  Laterality: Left;       Home Medications    Prior to Admission medications   Medication Sig Start Date End Date Taking? Authorizing Provider  Ascorbic Acid (VITAMIN C PO) Take 1 tablet by mouth daily.   Yes [provider]  B Complex Vitamins (VITAMIN B COMPLEX PO) Take 1 tablet by mouth daily.   Yes [provider]  Multiple Vitamin (MULTIVITAMIN WITH MINERALS) TABS tablet Take 1 tablet by mouth daily.   Yes [provider]  sennosides-docusate sodium (SENOKOT-S) 8.6-50 MG tablet Take 2 tablets by mouth daily. 04/21/13  Yes Marchia Bond, MD  simvastatin (ZOCOR) 20 MG tablet Take 20 mg by mouth daily.   Yes [provider]  tamsulosin (FLOMAX) 0.4 MG CAPS capsule Take 0.4 mg by mouth daily.   Yes [provider]  traMADol (ULTRAM) 50 MG tablet Take 50 mg by mouth daily as needed  (arthritic pain).   Yes [provider]  vitamin E 100 UNIT capsule Take 200 Units by mouth daily.   Yes [provider]  azithromycin (ZITHROMAX) 250 MG tablet Take 1 tablet (250 mg total) by mouth daily. Take first 2 tablets together, then 1 every day until finished. 09/23/17   Lorin Picket, PA-C  chlorpheniramine-HYDROcodone (TUSSIONEX PENNKINETIC ER) 10-8 MG/5ML SUER Take 5 mLs by mouth 2 (two) times daily. 09/23/17   Lorin Picket, PA-C  fluticasone (FLONASE) 50 MCG/ACT nasal spray Place 2 sprays into both nostrils daily. 09/23/17   Lorin Picket, PA-C    Family History Family History  Problem Relation Age of Onset  . Lung cancer Father     Social History Social History   Tobacco Use  . Smoking status: Never Smoker  . Smokeless tobacco: Never Used  Substance Use Topics  . Alcohol use: Yes    Comment: occasional beer  . Drug use: No     Allergies   Patient has no known allergies.   Review of Systems Review of Systems  Constitutional: Positive for activity change, chills and fatigue. Negative for fever.  HENT: Positive for congestion, postnasal drip and rhinorrhea.   Respiratory: Positive for cough and shortness of breath.   All other systems reviewed and are negative.    Physical Exam Triage  Vital Signs ED Triage Vitals  Enc Vitals Group     BP 09/23/17 1724 (!) 140/96     Pulse Rate 09/23/17 1724 68     Resp 09/23/17 1724 18     Temp 09/23/17 1724 98.8 F (37.1 C)     Temp Source 09/23/17 1724 Oral     SpO2 09/23/17 1724 99 %     Weight 09/23/17 1722 180 lb (81.6 kg)     Height 09/23/17 1722 6' (1.829 m)     Head Circumference --      Peak Flow --      Pain Score 09/23/17 1722 6     Pain Loc --      Pain Edu? --      Excl. in [place]? --    No data found.  Updated Vital Signs BP (!) 140/96 (BP Location: Left Arm)   Pulse 68   Temp 98.8 F (37.1 C) (Oral)   Resp 18   Ht 6' (1.829 m)   Wt 180 lb (81.6 kg)   SpO2 99%   BMI  24.41 kg/m   Visual Acuity Right Eye Distance:   Left Eye Distance:   Bilateral Distance:    Right Eye Near:   Left Eye Near:    Bilateral Near:     Physical Exam  Constitutional: He is oriented to person, place, and time. He appears well-developed and well-nourished. No distress.  HENT:  Head: Normocephalic.  Right Ear: External ear normal.  Left Ear: External ear normal.  Nose: Nose normal.  Mouth/Throat: Oropharynx is clear and moist. No oropharyngeal exudate.  Eyes: Pupils are equal, round, and reactive to light. Right eye exhibits no discharge. Left eye exhibits no discharge.  Neck: Normal range of motion.  Pulmonary/Chest: Effort normal. He has rales.  Musculoskeletal: Normal range of motion.  Neurological: He is alert and oriented to person, place, and time.  Skin: Skin is warm and dry. He is not diaphoretic.  Psychiatric: He has a normal mood and affect. His behavior is normal. Judgment and thought content normal.  Nursing note and vitals reviewed.    UC Treatments / Results  Labs (all labs ordered are listed, but only abnormal results are displayed) Labs Reviewed - No data to display  EKG None  Radiology No results found.  Procedures Procedures (including critical care time)  Medications Ordered in UC Medications - No data to display  Initial Impression / Assessment and Plan / UC Course  I have reviewed the triage vital signs and the nursing notes.  Pertinent labs & imaging results that were available during my care of the patient were reviewed by me and considered in my medical decision making (see chart for details).     Plan: 1. Test/x-ray results and diagnosis reviewed with patient 2. rx as per orders; risks, benefits, potential side effects reviewed with patient 3. Recommend supportive treatment with use of Tessalon Perles and inhaler as necessary.  Will provide him with Tussionex for nighttime use since he has been coughing at night.  Because  of the crackles in his left base and give sputum of brown mucus and his change in general feeling I will start him on azithromycin.  If he is not improving he should follow-up with his primary care physician Dr. Ellison Hughs. 4. F/u prn if symptoms worsen or don't improve  Final Clinical Impressions(s) / UC Diagnoses   Final diagnoses:  Upper respiratory tract infection, unspecified type   Discharge Instructions   None  ED Prescriptions    Medication Sig Dispense Auth. Provider   azithromycin (ZITHROMAX) 250 MG tablet Take 1 tablet (250 mg total) by mouth daily. Take first 2 tablets together, then 1 every day until finished. 6 tablet Lorin Picket, PA-C   chlorpheniramine-HYDROcodone (TUSSIONEX PENNKINETIC ER) 10-8 MG/5ML SUER Take 5 mLs by mouth 2 (two) times daily. 115 mL Crecencio Mc P, PA-C   fluticasone (FLONASE) 50 MCG/ACT nasal spray Place 2 sprays into both nostrils daily. 16 g Lorin Picket, PA-C     Controlled Substance Prescriptions [place] Controlled Substance Registry consulted? Not Applicable   Lorin Picket, PA-C 09/23/17 1811

## 2017-09-23 NOTE — ED Triage Notes (Signed)
Patient complains of cough, congestion x 6 days. Patient states that symptoms have worsened today with body aches and headache, nasal congestion. Patient states that cough has been productive of dark mucus. Patient states that he has recently pulled a few ticks off and wife was recently diagnosed with rocky mountain spotted fever. Patient states that he did do a teledoc visit last week and given tessalon perles without relief.

## 2019-07-28 ENCOUNTER — Other Ambulatory Visit: Payer: Self-pay

## 2019-07-28 ENCOUNTER — Ambulatory Visit: Payer: BC Managed Care – PPO | Attending: Podiatry | Admitting: Physical Therapy

## 2019-07-28 DIAGNOSIS — R269 Unspecified abnormalities of gait and mobility: Secondary | ICD-10-CM

## 2019-07-28 DIAGNOSIS — M6281 Muscle weakness (generalized): Secondary | ICD-10-CM | POA: Diagnosis present

## 2019-07-28 DIAGNOSIS — M25572 Pain in left ankle and joints of left foot: Secondary | ICD-10-CM

## 2019-07-28 DIAGNOSIS — M25675 Stiffness of left foot, not elsewhere classified: Secondary | ICD-10-CM

## 2019-07-31 NOTE — Patient Instructions (Signed)
Access Code: [email or web address]

## 2019-08-01 NOTE — Therapy (Addendum)
[facility] [address] [place], [place], [address] Phone: [phone]   Fax:  [phone]  Physical Therapy Evaluation  Patient Details  Name: Zachary Marshall MRN: [id number] Date of Birth: 1955-12-20 Referring Provider (PT): Dr. Vickki Muff   Encounter Date: 07/28/2019   PT End of Session - 08/01/19 1303    Visit Number  1    Number of Visits  8    Date for PT Re-Evaluation  08/25/19    Authorization - Visit Number  1    Authorization - Number of Visits  8    PT Start Time  [id number]    PT Stop Time  1806    PT Time Calculation (min)  55 min    Activity Tolerance  Patient tolerated treatment well;Patient limited by pain    Behavior During Therapy  [facility] for tasks assessed/performed         Past Medical History:  Diagnosis Date  . Arthritis   . 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 PONV (postoperative nausea and vomiting)     Past Surgical History:  Procedure Laterality Date  . APPENDECTOMY    . HERNIA REPAIR Right   . SHOULDER HEMI-ARTHROPLASTY Left 04/21/2013   Procedure: SHOULDER HEMI-ARTHROPLASTY, left;  Surgeon: Johnny Bridge, MD;  Location: [place];  Service: Orthopedics;  Laterality: Left;    There were no vitals filed for this visit.    Pt. reports chronic h/o L ankle/foot pain. Pt. has to wear steel toe shoes at work and walking a lot during a regular work day. Increase L foot pain related to increase walking. Pt. has rigid orthotics in B shoes.   Pt. Known well to PT clinic.        See flowsheet   Objective measurements completed on examination: See above findings.       See handouts    PT Long Term Goals - 07/29/19 2043      PT LONG TERM GOAL #1   Title  Pt. will increase FOTO to 63 to improve pain-free mobility.    Baseline  Initial FOTO: 44    Time  4    Period  Weeks    Status  New    Target Date  08/25/19      PT LONG TERM GOAL #2   Title  Pt. independent with HEP to increase L  ankle AROM to [facility] as compared to R ankle to improve pain-free mobility.    Baseline  See flowsheet (significant hypomobility with all planes)    Time  4    Period  Weeks    Status  New    Target Date  08/25/19      PT LONG TERM GOAL #3   Title  Pt. able to ambulate at work with proper shoes/orthotics and no increase c/o L ankle or foot pain/limitations.    Baseline  pain limited gait at work    Time  4    Period  Weeks    Status  New    Target Date  08/25/19      PT LONG TERM GOAL #4   Title  Pt. able to run 10 minutes with no increase c/o L ankle/foot pain.    Baseline  pt. unable to run at this time due to L ankle/foot pain.    Time  4    Period  Weeks    Status  New    Target Date  08/25/19  Pt. is a pleasant 64 y/o male with chronic h/o L ankle/foot pain. Pt. presents with significant pes planus and hypomobility in subtalar/ ankle joints. Poor great toe active extension/flexion. Pts. great toe on B feet in not in contact with floor with seated/standing posture. Increase medial ankle discomfort with EV/passive stretches. Antalgic gait pattern due to limited ankle mobility/ stability. PT discussed benefits of properly fitted orthotics/ shoes. FOTO: initial 44/ goal 63. Pt. will benefit from skilled PT services to increase ankle ROM/ stability to improve pain-free mobility.       Patient will benefit from skilled therapeutic intervention in order to improve the following deficits and impairments:  Abnormal gait, Decreased coordination, Decreased range of motion, Difficulty walking, Pain, Improper body mechanics, Impaired flexibility, Hypomobility, Decreased balance, Decreased mobility, Decreased strength  Visit Diagnosis: Pain in left ankle and joints of left foot  Joint stiffness of left foot  Gait difficulty  Muscle weakness (generalized)     Problem List Patient Active Problem List   Diagnosis Date Noted  . 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 [address] [place], [place], [address] Phone: [phone]   Fax:  [phone]  Name: Zachary Marshall MRN: [id number] Date of Birth: 12-Jun-1955

## 2019-08-04 ENCOUNTER — Ambulatory Visit: Payer: BC Managed Care – PPO | Admitting: Physical Therapy

## 2019-08-04 ENCOUNTER — Other Ambulatory Visit: Payer: Self-pay

## 2019-08-04 DIAGNOSIS — R269 Unspecified abnormalities of gait and mobility: Secondary | ICD-10-CM

## 2019-08-04 DIAGNOSIS — M25572 Pain in left ankle and joints of left foot: Secondary | ICD-10-CM | POA: Diagnosis not present

## 2019-08-04 DIAGNOSIS — M6281 Muscle weakness (generalized): Secondary | ICD-10-CM

## 2019-08-04 DIAGNOSIS — M25675 Stiffness of left foot, not elsewhere classified: Secondary | ICD-10-CM

## 2019-08-10 NOTE — Therapy (Addendum)
[facility] [address] [place], [place], [address] Phone: [phone]   Fax:  [phone]  Physical Therapy Treatment  Patient Details  Name: Zachary Marshall MRN: [id number] Date of Birth: [birth year] Referring Provider (PT): Dr. Vickki Muff   Encounter Date: 08/04/2019   PT End of Session - 08/10/19 1807    Visit Number  2    Number of Visits  8    Date for PT Re-Evaluation  08/25/19    Authorization - Visit Number  2    Authorization - Number of Visits  8    PT Start Time  1706    PT Stop Time  1750    PT Time Calculation (min)  44 min    Activity Tolerance  Patient tolerated treatment well;Patient limited by pain    Behavior During Therapy  [facility] for tasks assessed/performed         Past Medical History:  Diagnosis Date  . Arthritis   . 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 PONV (postoperative nausea and vomiting)     Past Surgical History:  Procedure Laterality Date  . APPENDECTOMY    . HERNIA REPAIR Right   . SHOULDER HEMI-ARTHROPLASTY Left 04/21/2013   Procedure: SHOULDER HEMI-ARTHROPLASTY, left;  Surgeon: Johnny Bridge, MD;  Location: [place];  Service: Orthopedics;  Laterality: Left;    There were no vitals filed for this visit.    Pt. pain limited with prolonged walking at work. Pt. planning to check if he is due a new pair of steel toed shoes at work. Pt. will look for a more comfortable/ sneaker type of shoe.         Manual tx.:  Supine L/R ankle stretches Subtalar/ navicular/ cuboid mobs. (significant hypomobility) Great toe stretches    PT Long Term Goals - 07/29/19 2043      PT LONG TERM GOAL #1   Title  Pt. will increase FOTO to 63 to improve pain-free mobility.    Baseline  Initial FOTO: 44    Time  4    Period  Weeks    Status  New    Target Date  08/25/19      PT LONG TERM GOAL #2   Title  Pt. independent with HEP to increase L ankle AROM to [facility] as compared to R ankle to  improve pain-free mobility.    Baseline  See flowsheet (significant hypomobility with all planes)    Time  4    Period  Weeks    Status  New    Target Date  08/25/19      PT LONG TERM GOAL #3   Title  Pt. able to ambulate at work with proper shoes/orthotics and no increase c/o L ankle or foot pain/limitations.    Baseline  pain limited gait at work    Time  4    Period  Weeks    Status  New    Target Date  08/25/19      PT LONG TERM GOAL #4   Title  Pt. able to run 10 minutes with no increase c/o L ankle/foot pain.    Baseline  pt. unable to run at this time due to L ankle/foot pain.    Time  4    Period  Weeks    Status  New    Target Date  08/25/19         Poor L great toe flexion/ extension. PT focused on  manual IV/EV of ankles and review of HEP. PT discussed [place] [employer] and computerized foot analysis/ orthotics and proper shoe fitting. No change to HEP.      Patient will benefit from skilled therapeutic intervention in order to improve the following deficits and impairments:  Abnormal gait, Decreased coordination, Decreased range of motion, Difficulty walking, Pain, Improper body mechanics, Impaired flexibility, Hypomobility, Decreased balance, Decreased mobility, Decreased strength  Visit Diagnosis: Pain in left ankle and joints of left foot  Joint stiffness of left foot  Gait difficulty  Muscle weakness (generalized)     Problem List Patient Active Problem List   Diagnosis Date Noted  . 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

## 2019-08-11 ENCOUNTER — Ambulatory Visit: Payer: BC Managed Care – PPO | Admitting: Physical Therapy

## 2019-08-11 ENCOUNTER — Other Ambulatory Visit: Payer: Self-pay

## 2019-08-11 DIAGNOSIS — M25572 Pain in left ankle and joints of left foot: Secondary | ICD-10-CM

## 2019-08-11 DIAGNOSIS — M25675 Stiffness of left foot, not elsewhere classified: Secondary | ICD-10-CM

## 2019-08-11 DIAGNOSIS — M6281 Muscle weakness (generalized): Secondary | ICD-10-CM

## 2019-08-11 DIAGNOSIS — R269 Unspecified abnormalities of gait and mobility: Secondary | ICD-10-CM

## 2019-08-15 NOTE — Therapy (Addendum)
[facility] [address] [place], [place], [address] Phone: [phone]   Fax:  [phone]  Physical Therapy Treatment  Patient Details  Name: Zachary Marshall MRN: [id number] Date of Birth: 30-Jul-1955 Referring Provider (PT): Dr. Vickki Muff   Encounter Date: 08/11/2019   PT End of Session - 08/15/19 0720    Visit Number  3    Number of Visits  8    Date for PT Re-Evaluation  08/25/19    Authorization - Visit Number  3    Authorization - Number of Visits  8    PT Start Time  [id number]    PT Stop Time  1801    PT Time Calculation (min)  44 min         Past Medical History:  Diagnosis Date  . Arthritis   . 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 PONV (postoperative nausea and vomiting)     Past Surgical History:  Procedure Laterality Date  . APPENDECTOMY    . HERNIA REPAIR Right   . SHOULDER HEMI-ARTHROPLASTY Left 04/21/2013   Procedure: SHOULDER HEMI-ARTHROPLASTY, left;  Surgeon: Johnny Bridge, MD;  Location: [place];  Service: Orthopedics;  Laterality: Left;    There were no vitals filed for this visit.    No new complaints. Pt. hasn't received orthotics at this time. Pt. ordered [place] shoes.      Manual tx.:  Supine B ankle/great toe stretches (generalized) STM to plantar aspect of feet with great toe extension Reviewed HEP    PT Long Term Goals - 07/29/19 2043      PT LONG TERM GOAL #1   Title  Pt. will increase FOTO to 63 to improve pain-free mobility.    Baseline  Initial FOTO: 44    Time  4    Period  Weeks    Status  New    Target Date  08/25/19      PT LONG TERM GOAL #2   Title  Pt. independent with HEP to increase L ankle AROM to [facility] as compared to R ankle to improve pain-free mobility.    Baseline  See flowsheet (significant hypomobility with all planes)    Time  4    Period  Weeks    Status  New    Target Date  08/25/19      PT LONG TERM GOAL #3   Title  Pt. able to ambulate at work with  proper shoes/orthotics and no increase c/o L ankle or foot pain/limitations.    Baseline  pain limited gait at work    Time  4    Period  Weeks    Status  New    Target Date  08/25/19      PT LONG TERM GOAL #4   Title  Pt. able to run 10 minutes with no increase c/o L ankle/foot pain.    Baseline  pt. unable to run at this time due to L ankle/foot pain.    Time  4    Period  Weeks    Status  New    Target Date  08/25/19         PT focus on ankle/metatarsal joint mobility and increase great toe extension/ plantar extension. Pt. tolerates tx. well but no increase c/o pain. Pt. educated on seated great toe extension towel crunches.      Patient will benefit from skilled therapeutic intervention in order to improve the following deficits and impairments:  Abnormal gait, Decreased coordination, Decreased range of motion, Difficulty walking, Pain, Improper body mechanics, Impaired flexibility, Hypomobility, Decreased balance, Decreased mobility, Decreased strength  Visit Diagnosis: Pain in left ankle and joints of left foot  Joint stiffness of left foot  Gait difficulty  Muscle weakness (generalized)     Problem List Patient Active Problem List   Diagnosis Date Noted  . 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 [place], [place], [address] Phone: [phone]   Fax:  [phone]  Name: BLAIK YOOS MRN: [id number] Date of Birth: 05-Jul-1955

## 2019-08-18 ENCOUNTER — Ambulatory Visit: Payer: BC Managed Care – PPO | Admitting: Physical Therapy

## 2019-08-25 ENCOUNTER — Ambulatory Visit: Payer: BC Managed Care – PPO | Attending: Podiatry | Admitting: Physical Therapy

## 2019-08-25 ENCOUNTER — Other Ambulatory Visit: Payer: Self-pay

## 2019-08-25 DIAGNOSIS — M25675 Stiffness of left foot, not elsewhere classified: Secondary | ICD-10-CM

## 2019-08-25 DIAGNOSIS — M6281 Muscle weakness (generalized): Secondary | ICD-10-CM | POA: Diagnosis present

## 2019-08-25 DIAGNOSIS — M25572 Pain in left ankle and joints of left foot: Secondary | ICD-10-CM | POA: Diagnosis not present

## 2019-08-25 DIAGNOSIS — R269 Unspecified abnormalities of gait and mobility: Secondary | ICD-10-CM | POA: Diagnosis present

## 2019-08-29 NOTE — Therapy (Addendum)
[facility] [address] [place], [place], [address] Phone: [phone]   Fax:  [phone]  Physical Therapy Treatment  Patient Details  Name: Zachary Marshall MRN: [id number] Date of Birth: 1955-12-21 Referring Provider (PT): Dr. Vickki Muff   Encounter Date: 08/25/2019   PT End of Session - 08/29/19 0918    Visit Number  4    Number of Visits  8    Authorization - Visit Number  4    Authorization - Number of Visits  8    PT Start Time  [id number]    PT Stop Time  [id number]    PT Time Calculation (min)  47 min        Past Medical History:  Diagnosis Date  . Arthritis   . 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 PONV (postoperative nausea and vomiting)     Past Surgical History:  Procedure Laterality Date  . APPENDECTOMY    . HERNIA REPAIR Right   . SHOULDER HEMI-ARTHROPLASTY Left 04/21/2013   Procedure: SHOULDER HEMI-ARTHROPLASTY, left;  Surgeon: Johnny Bridge, MD;  Location: [place];  Service: Orthopedics;  Laterality: Left;    There were no vitals filed for this visit.    Pt. still waiting on orthotics/ shoes. Pt. states he continues to be limited with prolonged walking at work.       Manual tx.:  Supine B ankle/great toe stretches (generalized)- 21 min. STM to plantar aspect of feet with great toe extension Subtalar/navicular mobs. (grade II-III)- moderate to significant hypomobility.       PT Long Term Goals - 07/29/19 2043      PT LONG TERM GOAL #1   Title Pt. will increase FOTO to 63 to improve pain-free mobility.    Baseline Initial FOTO: 44    Time 4    Period Weeks    Status New    Target Date 08/25/19      PT LONG TERM GOAL #2   Title Pt. independent with HEP to increase L ankle AROM to [facility] as compared to R ankle to improve pain-free mobility.    Baseline See flowsheet (significant hypomobility with all planes)    Time 4    Period Weeks    Status New    Target Date 08/25/19      PT LONG TERM  GOAL #3   Title Pt. able to ambulate at work with proper shoes/orthotics and no increase c/o L ankle or foot pain/limitations.    Baseline pain limited gait at work    Time 4    Period Weeks    Status New    Target Date 08/25/19      PT LONG TERM GOAL #4   Title Pt. able to run 10 minutes with no increase c/o L ankle/foot pain.    Baseline pt. unable to run at this time due to L ankle/foot pain.    Time 4    Period Weeks    Status New    Target Date 08/25/19             Primary tx. focus on ankle stretches/ mobility. Pt. limited by moderate/significant hypomobility in L ankle/foot. Limited great toe flexion/ extension and LAD/mobs. to metatarsals. Pt. still waiting on orthotics/ proper shoewear to assist with ankle support while walking at home/ work.      Patient will benefit from skilled therapeutic intervention in order to improve the following deficits and impairments:  Abnormal gait, Decreased coordination,  Decreased range of motion, Difficulty walking, Pain, Improper body mechanics, Impaired flexibility, Hypomobility, Decreased balance, Decreased mobility, Decreased strength  Visit Diagnosis: Pain in left ankle and joints of left foot  Joint stiffness of left foot  Gait difficulty  Muscle weakness (generalized)     Problem List Patient Active Problem List   Diagnosis Date Noted  . 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

## 2019-09-10 ENCOUNTER — Other Ambulatory Visit: Payer: Self-pay

## 2019-09-10 ENCOUNTER — Ambulatory Visit: Payer: BC Managed Care – PPO | Admitting: Physical Therapy

## 2019-09-10 DIAGNOSIS — M25572 Pain in left ankle and joints of left foot: Secondary | ICD-10-CM

## 2019-09-10 DIAGNOSIS — R269 Unspecified abnormalities of gait and mobility: Secondary | ICD-10-CM

## 2019-09-10 DIAGNOSIS — M6281 Muscle weakness (generalized): Secondary | ICD-10-CM

## 2019-09-10 DIAGNOSIS — M25675 Stiffness of left foot, not elsewhere classified: Secondary | ICD-10-CM

## 2019-09-12 NOTE — Therapy (Addendum)
[facility] [address] [place], [place], [address] Phone: [phone]   Fax:  [phone]  Physical Therapy Treatment  Patient Details  Name: Zachary Marshall MRN: [id number] Date of Birth: 02-19-1956 Referring Provider (PT): Dr. Vickki Muff   Encounter Date: 09/10/2019    Past Medical History:  Diagnosis Date  . Arthritis   . 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 PONV (postoperative nausea and vomiting)     Past Surgical History:  Procedure Laterality Date  . APPENDECTOMY    . HERNIA REPAIR Right   . SHOULDER HEMI-ARTHROPLASTY Left 04/21/2013   Procedure: SHOULDER HEMI-ARTHROPLASTY, left;  Surgeon: Johnny Bridge, MD;  Location: [place];  Service: Orthopedics;  Laterality: Left;    There were no vitals filed for this visit.       No treatment today.  Pts. daily note opened in error.         PT Long Term Goals - 07/29/19 2043      PT LONG TERM GOAL #1   Title Pt. will increase FOTO to 63 to improve pain-free mobility.    Baseline Initial FOTO: 44    Time 4    Period Weeks    Status New    Target Date 08/25/19      PT LONG TERM GOAL #2   Title Pt. independent with HEP to increase L ankle AROM to [facility] as compared to R ankle to improve pain-free mobility.    Baseline See flowsheet (significant hypomobility with all planes)    Time 4    Period Weeks    Status New    Target Date 08/25/19      PT LONG TERM GOAL #3   Title Pt. able to ambulate at work with proper shoes/orthotics and no increase c/o L ankle or foot pain/limitations.    Baseline pain limited gait at work    Time 4    Period Weeks    Status New    Target Date 08/25/19      PT LONG TERM GOAL #4   Title Pt. able to run 10 minutes with no increase c/o L ankle/foot pain.    Baseline pt. unable to run at this time due to L ankle/foot pain.    Time 4    Period Weeks    Status New    Target Date 08/25/19                  Patient will  benefit from skilled therapeutic intervention in order to improve the following deficits and impairments:  Abnormal gait, Decreased coordination, Decreased range of motion, Difficulty walking, Pain, Improper body mechanics, Impaired flexibility, Hypomobility, Decreased balance, Decreased mobility, Decreased strength  Visit Diagnosis: Pain in left ankle and joints of left foot  Joint stiffness of left foot  Gait difficulty  Muscle weakness (generalized)     Problem List Patient Active Problem List   Diagnosis Date Noted  . 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 [place], [place], [address] Phone: [phone]   Fax:  [phone]  Name: Zachary Marshall MRN: [id number] Date of Birth: [birth year]

## 2019-09-22 ENCOUNTER — Ambulatory Visit: Payer: BC Managed Care – PPO | Attending: Podiatry | Admitting: Physical Therapy

## 2019-09-22 ENCOUNTER — Other Ambulatory Visit: Payer: Self-pay

## 2019-09-22 DIAGNOSIS — M25572 Pain in left ankle and joints of left foot: Secondary | ICD-10-CM

## 2019-09-22 DIAGNOSIS — R269 Unspecified abnormalities of gait and mobility: Secondary | ICD-10-CM

## 2019-09-22 DIAGNOSIS — M6281 Muscle weakness (generalized): Secondary | ICD-10-CM

## 2019-09-22 DIAGNOSIS — M25675 Stiffness of left foot, not elsewhere classified: Secondary | ICD-10-CM

## 2019-09-25 NOTE — Therapy (Addendum)
[facility] [address] [place], [place], [address] Phone: [phone]   Fax:  [phone]  Physical Therapy Treatment  Patient Details  Name: Zachary Marshall MRN: [id number] Date of Birth: Oct 04, 1955 Referring Provider (PT): Dr. Vickki Muff   Encounter Date: 09/22/2019    Past Medical History:  Diagnosis Date  . Arthritis   . 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 PONV (postoperative nausea and vomiting)     Past Surgical History:  Procedure Laterality Date  . APPENDECTOMY    . HERNIA REPAIR Right   . SHOULDER HEMI-ARTHROPLASTY Left 04/21/2013   Procedure: SHOULDER HEMI-ARTHROPLASTY, left;  Surgeon: Johnny Bridge, MD;  Location: [facility];  Service: Orthopedics;  Laterality: Left;    There were no vitals filed for this visit.         Pt. stopped by clinic for PT to examine new steel toe work boots and orthotics.  No charge for tx. and pt. instructed to use new orthotics/shoes over the next few weeks.  If pain persists, pt. will contact PT or Dr. Donnie Mesa for a 2nd opinion.  Pt. will need RECERT if future PT visits needed.        PT Long Term Goals - 07/29/19 2043      PT LONG TERM GOAL #1   Title Pt. will increase FOTO to 63 to improve pain-free mobility.    Baseline Initial FOTO: 44    Time 4    Period Weeks    Status New    Target Date 08/25/19      PT LONG TERM GOAL #2   Title Pt. independent with HEP to increase L ankle AROM to [facility] as compared to R ankle to improve pain-free mobility.    Baseline See flowsheet (significant hypomobility with all planes)    Time 4    Period Weeks    Status New    Target Date 08/25/19      PT LONG TERM GOAL #3   Title Pt. able to ambulate at work with proper shoes/orthotics and no increase c/o L ankle or foot pain/limitations.    Baseline pain limited gait at work    Time 4    Period Weeks    Status New    Target Date 08/25/19      PT LONG TERM GOAL #4   Title Pt. able  to run 10 minutes with no increase c/o L ankle/foot pain.    Baseline pt. unable to run at this time due to L ankle/foot pain.    Time 4    Period Weeks    Status New    Target Date 08/25/19                  Patient will benefit from skilled therapeutic intervention in order to improve the following deficits and impairments:  Abnormal gait, Decreased coordination, Decreased range of motion, Difficulty walking, Pain, Improper body mechanics, Impaired flexibility, Hypomobility, Decreased balance, Decreased mobility, Decreased strength  Visit Diagnosis: Pain in left ankle and joints of left foot  Joint stiffness of left foot  Gait difficulty  Muscle weakness (generalized)     Problem List Patient Active Problem List   Diagnosis Date Noted  . 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

## 2020-01-15 ENCOUNTER — Other Ambulatory Visit: Payer: Self-pay | Admitting: Orthopedic Surgery

## 2020-01-15 DIAGNOSIS — M25511 Pain in right shoulder: Secondary | ICD-10-CM

## 2020-01-27 ENCOUNTER — Ambulatory Visit
Admission: RE | Admit: 2020-01-27 | Discharge: 2020-01-27 | Disposition: A | Discharge status: Home / Self Care | Payer: BC Managed Care – PPO | Source: Ambulatory Visit | Attending: Orthopedic Surgery | Admitting: Orthopedic Surgery

## 2020-01-27 ENCOUNTER — Other Ambulatory Visit: Payer: Self-pay

## 2020-01-27 DIAGNOSIS — M25511 Pain in right shoulder: Secondary | ICD-10-CM

## 2020-03-01 HISTORY — PX: TOTAL ANKLE ARTHROPLASTY: SHX811

## 2020-04-29 ENCOUNTER — Other Ambulatory Visit: Payer: Self-pay

## 2020-04-29 ENCOUNTER — Encounter (HOSPITAL_BASED_OUTPATIENT_CLINIC_OR_DEPARTMENT_OTHER): Payer: Self-pay | Admitting: Orthopaedic Surgery

## 2020-05-02 ENCOUNTER — Other Ambulatory Visit (HOSPITAL_COMMUNITY)
Admission: RE | Admit: 2020-05-02 | Discharge: 2020-05-02 | Disposition: A | Discharge status: Home / Self Care | Payer: BC Managed Care – PPO | Source: Ambulatory Visit | Attending: Orthopaedic Surgery | Admitting: Orthopaedic Surgery

## 2020-05-02 ENCOUNTER — Encounter (HOSPITAL_BASED_OUTPATIENT_CLINIC_OR_DEPARTMENT_OTHER)
Admission: RE | Admit: 2020-05-02 | Discharge: 2020-05-02 | Disposition: A | Discharge status: Home / Self Care | Payer: BC Managed Care – PPO | Source: Ambulatory Visit | Attending: Orthopaedic Surgery | Admitting: Orthopaedic Surgery

## 2020-05-02 DIAGNOSIS — Z01812 Encounter for preprocedural laboratory examination: Secondary | ICD-10-CM | POA: Insufficient documentation

## 2020-05-02 DIAGNOSIS — M25711 Osteophyte, right shoulder: Secondary | ICD-10-CM | POA: Diagnosis not present

## 2020-05-02 DIAGNOSIS — M19011 Primary osteoarthritis, right shoulder: Secondary | ICD-10-CM | POA: Diagnosis not present

## 2020-05-02 DIAGNOSIS — Z20822 Contact with and (suspected) exposure to covid-19: Secondary | ICD-10-CM | POA: Insufficient documentation

## 2020-05-02 DIAGNOSIS — Z79899 Other long term (current) drug therapy: Secondary | ICD-10-CM | POA: Diagnosis not present

## 2020-05-02 LAB — SARS CORONAVIRUS 2 (TAT 6-24 HRS): SARS Coronavirus 2: NEGATIVE

## 2020-05-02 LAB — SURGICAL PCR SCREEN
MRSA, PCR: NEGATIVE
Staphylococcus aureus: NEGATIVE

## 2020-05-02 NOTE — Progress Notes (Signed)
      Enhanced Recovery after Surgery for Orthopedics Enhanced Recovery after Surgery is a protocol used to improve the stress on your body and your recovery after surgery.  Patient Instructions  . The night before surgery:  o No food after midnight. ONLY clear liquids after midnight  . The day of surgery (if you do NOT have diabetes):  o Drink ONE (1) Pre-Surgery Clear Ensure as directed.   o This drink was given to you during your hospital  pre-op appointment visit. o The pre-op nurse will instruct you on the time to drink the  Pre-Surgery Ensure depending on your surgery time. o Finish the drink at the designated time by the pre-op nurse.  o Nothing else to drink after completing the  Pre-Surgery Clear Ensure.  . The day of surgery (if you have diabetes): o Drink ONE (1) Gatorade 2 (G2) as directed. o This drink was given to you during your hospital  pre-op appointment visit.  o The pre-op nurse will instruct you on the time to drink the   Gatorade 2 (G2) depending on your surgery time. o Color of the Gatorade may vary. Red is not allowed. o Nothing else to drink after completing the  Gatorade 2 (G2).         If you have questions, please contact your surgeon's office. Surgical soap given with instructions, pt verbalized understanding. Benzoyl peroxide gel given with written instructions, pt verbalized understanding.  

## 2020-05-03 NOTE — H&P (Signed)
SHOULDER ARTHROPLASTY ADMISSION H&P  Patient ID: Zachary Marshall MRN: [id number] DOB/AGE: Jul 11, 1955 64 y.o.  Chief Complaint: right shoulder pain.  Planned Procedure Date: 05/05/20 Medical Clearance by Dr. Guss Bunde   HPI: Zachary Marshall is a 64 y.o. male who presents for evaluation of PRIMARY OSTEOARHTRITIS RIGHT SHOULDER. The patient has a history of pain and functional disability in the right shoulder due to arthritis and has failed non-surgical conservative treatments for greater than 12 weeks to include NSAID's and/or analgesics, corticosteriod injections and activity modification.  Onset of symptoms was gradual, starting 6 years ago with gradually worsening course since that time. The patient noted no past surgery on the right shoulder.  Patient currently rates pain at 8 out of 10 with activity. Patient has worsening of pain with activity and weight bearing, pain that interferes with activities of daily living and pain with passive range of motion.  Patient has evidence of periarticular osteophytes and joint space narrowing by imaging studies.  There is no active infection.  Past Medical History:  Diagnosis Date  . Arthritis   . 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 PONV (postoperative nausea and vomiting)    Past Surgical History:  Procedure Laterality Date  . APPENDECTOMY    . HERNIA REPAIR Right   . SHOULDER HEMI-ARTHROPLASTY Left 04/21/2013   Procedure: SHOULDER HEMI-ARTHROPLASTY, left;  Surgeon: Johnny Bridge, MD;  Location: [place];  Service: Orthopedics;  Laterality: Left;  . TOTAL ANKLE ARTHROPLASTY Left 03/01/2020   at [facility]   No Known Allergies Prior to Admission medications   Medication Sig Start Date End Date Taking? Authorizing Provider  Ascorbic Acid (VITAMIN C PO) Take 1 tablet by mouth daily.   Yes [provider]  B Complex Vitamins (VITAMIN B COMPLEX PO) Take 1 tablet by mouth daily.   Yes [provider]   Multiple Vitamin (MULTIVITAMIN WITH MINERALS) TABS tablet Take 1 tablet by mouth daily.   Yes [provider]  simvastatin (ZOCOR) 20 MG tablet Take 20 mg by mouth daily.   Yes [provider]  tamsulosin (FLOMAX) 0.4 MG CAPS capsule Take 0.4 mg by mouth daily.   Yes [provider]  traMADol (ULTRAM) 50 MG tablet Take 50 mg by mouth daily as needed (arthritic pain).   Yes [provider]  vitamin E 100 UNIT capsule Take 200 Units by mouth daily.   Yes [provider]   Social History   Socioeconomic History  . Marital status: Married    Spouse name: Not on file  . Number of children: Not on file  . Years of education: Not on file  . Highest education level: Not on file  Occupational History  . Not on file  Tobacco Use  . Smoking status: Never Smoker  . Smokeless tobacco: Never Used  Vaping Use  . Vaping Use: Never used  Substance and Sexual Activity  . Alcohol use: Yes    Comment: occasional beer  . Drug use: No  . Sexual activity: Yes  Other Topics Concern  . Not on file  Social History Narrative  . Not on file   Social Determinants of Health   Financial Resource Strain: Not on file  Food Insecurity: Not on file  Transportation Needs: Not on file  Physical Activity: Not on file  Stress: Not on file  Social Connections: Not on file   Family History  Problem Relation Age of Onset  . Lung cancer Father  ROS: Currently denies lightheadedness, dizziness, Fever, chills, CP, SOB. No personal history of DVT, PE, MI, or CVA. No loose teeth or dentures All other systems have been reviewed and were otherwise currently negative with the exception of those mentioned in the HPI and as above.  Objective: Vitals: HT: 6 feet; WT: 176.4 pounds; BMI: 23.9; TEMP: 97.41F; BP: 132/91; PULSE: 80 bpm; O2: 100% on room air.   Physical Exam: General: Alert, NAD.   HEENT: EOMI, Good Neck Extension  Pulm: No increased work of breathing.   Clear B/L A/P w/o crackle or wheeze. CV: RRR, No m/g/r appreciated  GI: soft, NT, ND Neuro: Neuro without gross focal deficit.  Sensation intact distally Skin: No lesions in the area of chief complaint MSK/Surgical Site: 0-95 degrees of forward flexion; 0-85 degrees of abduction; external rotation 5 degrees; internal rotation to the lateral right side.  No AC joint tenderness.  Imaging Review Plain radiographs demonstrate severe degenerative joint disease of the right shoulder.   Assessment: PRIMARY OSTEOARHTRITIS RIGHT SHOULDER  Plan: Plan for Procedure(s): RIGHT TOTAL SHOULDER ARTHROPLASTY  The patient history, physical exam, clinical judgement of the provider and imaging are consistent with end stage degenerative joint disease and total joint arthroplasty is deemed medically necessary. The treatment options including medical management, injection therapy, and arthroplasty were discussed at length. The risks and benefits of Procedure(s): RIGHT TOTAL SHOULDER ARTHROPLASTY were presented and reviewed.  The risks of nonoperative treatment, versus surgical intervention including but not limited to continued pain, aseptic loosening, stiffness, dislocation/subluxation, infection, bleeding, nerve injury, blood clots, cardiopulmonary complications, morbidity, mortality, among others were discussed. The patient verbalizes understanding and wishes to proceed with the plan.   Dental prophylaxis discussed and recommended for 2 years postoperatively.   The patient does meet the criteria for TXA which will be used perioperatively.    Patient's anticipated LOS is less than 2 midnights, meeting these requirements: - Younger than 6 - Lives within 1 hour of care - Has a competent adult at home to recover with post-op recover - NO history of  - Chronic pain requiring opiods  - Diabetes  - Coronary Artery Disease  - Heart failure  - Heart attack  - Stroke  - DVT/VTE  - Cardiac arrhythmia  -  Respiratory Failure/COPD  - Renal failure  - Anemia  - Advanced Liver disease        Ventura Bruns, PA-C 05/03/2020 9:58 AM

## 2020-05-05 ENCOUNTER — Ambulatory Visit (HOSPITAL_BASED_OUTPATIENT_CLINIC_OR_DEPARTMENT_OTHER): Payer: BC Managed Care – PPO | Admitting: Anesthesiology

## 2020-05-05 ENCOUNTER — Encounter (HOSPITAL_BASED_OUTPATIENT_CLINIC_OR_DEPARTMENT_OTHER): Payer: Self-pay | Admitting: Orthopedic Surgery

## 2020-05-05 ENCOUNTER — Other Ambulatory Visit: Payer: Self-pay

## 2020-05-05 ENCOUNTER — Encounter (HOSPITAL_BASED_OUTPATIENT_CLINIC_OR_DEPARTMENT_OTHER)
Admission: RE | Disposition: A | Discharge status: Home / Self Care | Payer: Self-pay | Source: Home / Self Care | Attending: Orthopedic Surgery

## 2020-05-05 ENCOUNTER — Ambulatory Visit (HOSPITAL_BASED_OUTPATIENT_CLINIC_OR_DEPARTMENT_OTHER)
Admission: RE | Admit: 2020-05-05 | Discharge: 2020-05-05 | Disposition: A | Discharge status: Home / Self Care | Payer: BC Managed Care – PPO | Attending: Orthopedic Surgery | Admitting: Orthopedic Surgery

## 2020-05-05 ENCOUNTER — Ambulatory Visit (HOSPITAL_COMMUNITY): Payer: BC Managed Care – PPO

## 2020-05-05 DIAGNOSIS — M25711 Osteophyte, right shoulder: Secondary | ICD-10-CM | POA: Insufficient documentation

## 2020-05-05 DIAGNOSIS — M19011 Primary osteoarthritis, right shoulder: Secondary | ICD-10-CM | POA: Diagnosis not present

## 2020-05-05 DIAGNOSIS — Z20822 Contact with and (suspected) exposure to covid-19: Secondary | ICD-10-CM | POA: Insufficient documentation

## 2020-05-05 DIAGNOSIS — Z96611 Presence of right artificial shoulder joint: Secondary | ICD-10-CM

## 2020-05-05 DIAGNOSIS — Z79899 Other long term (current) drug therapy: Secondary | ICD-10-CM | POA: Insufficient documentation

## 2020-05-05 HISTORY — PX: TOTAL SHOULDER ARTHROPLASTY: SHX126

## 2020-05-05 SURGERY — ARTHROPLASTY, SHOULDER, TOTAL
Anesthesia: Regional | Site: Shoulder | Laterality: Right

## 2020-05-05 MED ORDER — TRANEXAMIC ACID-NACL 1000-0.7 MG/100ML-% IV SOLN
1000.0000 mg | INTRAVENOUS | Status: AC
  Administered 2020-05-05: 1000 mg via INTRAVENOUS

## 2020-05-05 MED ORDER — PROPOFOL 500 MG/50ML IV EMUL
INTRAVENOUS | Status: AC
  Filled 2020-05-05: qty 50

## 2020-05-05 MED ORDER — EPHEDRINE SULFATE-NACL 50-0.9 MG/10ML-% IV SOSY
PREFILLED_SYRINGE | INTRAVENOUS | Status: DC | PRN
  Administered 2020-05-05 (×2): 15 mg via INTRAVENOUS

## 2020-05-05 MED ORDER — CEFAZOLIN SODIUM-DEXTROSE 2-4 GM/100ML-% IV SOLN
2.0000 g | INTRAVENOUS | Status: AC
  Administered 2020-05-05: 2 g via INTRAVENOUS

## 2020-05-05 MED ORDER — PROPOFOL 500 MG/50ML IV EMUL
INTRAVENOUS | Status: DC | PRN
  Administered 2020-05-05: 150 ug/kg/min via INTRAVENOUS

## 2020-05-05 MED ORDER — LACTATED RINGERS IV SOLN: INTRAVENOUS | Status: DC

## 2020-05-05 MED ORDER — FENTANYL CITRATE (PF) 100 MCG/2ML IJ SOLN: 100.0000 ug | Freq: Once | INTRAMUSCULAR | Status: DC

## 2020-05-05 MED ORDER — TRANEXAMIC ACID-NACL 1000-0.7 MG/100ML-% IV SOLN
INTRAVENOUS | Status: AC
  Filled 2020-05-05: qty 100

## 2020-05-05 MED ORDER — 0.9 % SODIUM CHLORIDE (POUR BTL) OPTIME
TOPICAL | Status: DC | PRN
  Administered 2020-05-05: 1000 mL

## 2020-05-05 MED ORDER — SODIUM CHLORIDE 0.9 % IR SOLN
Status: DC | PRN
  Administered 2020-05-05: 3000 mL

## 2020-05-05 MED ORDER — LACTATED RINGERS IV SOLN: INTRAVENOUS | Status: DC | PRN

## 2020-05-05 MED ORDER — OXYCODONE HCL 5 MG PO TABS: 5.0000 mg | ORAL_TABLET | ORAL | 0 refills | Status: DC | PRN

## 2020-05-05 MED ORDER — LIDOCAINE HCL (CARDIAC) PF 100 MG/5ML IV SOSY
PREFILLED_SYRINGE | INTRAVENOUS | Status: DC | PRN
  Administered 2020-05-05: 20 mg via INTRAVENOUS

## 2020-05-05 MED ORDER — ONDANSETRON HCL 4 MG PO TABS: 4.0000 mg | ORAL_TABLET | Freq: Three times a day (TID) | ORAL | 0 refills | Status: DC | PRN

## 2020-05-05 MED ORDER — ONDANSETRON HCL 4 MG/2ML IJ SOLN
INTRAMUSCULAR | Status: DC | PRN
  Administered 2020-05-05: 4 mg via INTRAVENOUS

## 2020-05-05 MED ORDER — BUPIVACAINE HCL (PF) 0.5 % IJ SOLN
INTRAMUSCULAR | Status: DC | PRN
  Administered 2020-05-05: 15 mL via PERINEURAL

## 2020-05-05 MED ORDER — SENNA-DOCUSATE SODIUM 8.6-50 MG PO TABS: 2.0000 | ORAL_TABLET | Freq: Every day | ORAL | 1 refills | Status: DC

## 2020-05-05 MED ORDER — ROCURONIUM BROMIDE 10 MG/ML (PF) SYRINGE
PREFILLED_SYRINGE | INTRAVENOUS | Status: AC
  Filled 2020-05-05: qty 10

## 2020-05-05 MED ORDER — SCOPOLAMINE 1 MG/3DAYS TD PT72
MEDICATED_PATCH | TRANSDERMAL | Status: AC
  Filled 2020-05-05: qty 1

## 2020-05-05 MED ORDER — FENTANYL CITRATE (PF) 100 MCG/2ML IJ SOLN
INTRAMUSCULAR | Status: AC
  Filled 2020-05-05: qty 2

## 2020-05-05 MED ORDER — BACLOFEN 10 MG PO TABS: 10.0000 mg | ORAL_TABLET | Freq: Three times a day (TID) | ORAL | 0 refills | Status: DC

## 2020-05-05 MED ORDER — ACETAMINOPHEN 500 MG PO TABS
ORAL_TABLET | ORAL | Status: AC
  Filled 2020-05-05: qty 2

## 2020-05-05 MED ORDER — ONDANSETRON HCL 4 MG/2ML IJ SOLN
INTRAMUSCULAR | Status: AC
  Filled 2020-05-05: qty 2

## 2020-05-05 MED ORDER — SUGAMMADEX SODIUM 200 MG/2ML IV SOLN
INTRAVENOUS | Status: DC | PRN
  Administered 2020-05-05: 200 mg via INTRAVENOUS

## 2020-05-05 MED ORDER — PHENYLEPHRINE 40 MCG/ML (10ML) SYRINGE FOR IV PUSH (FOR BLOOD PRESSURE SUPPORT)
PREFILLED_SYRINGE | INTRAVENOUS | Status: AC
  Filled 2020-05-05: qty 10

## 2020-05-05 MED ORDER — LIDOCAINE 2% (20 MG/ML) 5 ML SYRINGE
INTRAMUSCULAR | Status: AC
  Filled 2020-05-05: qty 5

## 2020-05-05 MED ORDER — PROPOFOL 500 MG/50ML IV EMUL
INTRAVENOUS | Status: AC
  Filled 2020-05-05: qty 100

## 2020-05-05 MED ORDER — PHENYLEPHRINE 40 MCG/ML (10ML) SYRINGE FOR IV PUSH (FOR BLOOD PRESSURE SUPPORT)
PREFILLED_SYRINGE | INTRAVENOUS | Status: DC | PRN
  Administered 2020-05-05 (×3): 80 ug via INTRAVENOUS
  Administered 2020-05-05: 120 ug via INTRAVENOUS
  Administered 2020-05-05: 80 ug via INTRAVENOUS

## 2020-05-05 MED ORDER — PROPOFOL 10 MG/ML IV BOLUS
INTRAVENOUS | Status: AC
  Filled 2020-05-05: qty 20

## 2020-05-05 MED ORDER — DEXAMETHASONE SODIUM PHOSPHATE 10 MG/ML IJ SOLN
INTRAMUSCULAR | Status: AC
  Filled 2020-05-05: qty 1

## 2020-05-05 MED ORDER — FENTANYL CITRATE (PF) 100 MCG/2ML IJ SOLN: 25.0000 ug | INTRAMUSCULAR | Status: DC | PRN

## 2020-05-05 MED ORDER — EPHEDRINE 5 MG/ML INJ
INTRAVENOUS | Status: AC
  Filled 2020-05-05: qty 10

## 2020-05-05 MED ORDER — ACETAMINOPHEN 500 MG PO TABS
1000.0000 mg | ORAL_TABLET | Freq: Once | ORAL | Status: AC
  Administered 2020-05-05: 1000 mg via ORAL

## 2020-05-05 MED ORDER — ROCURONIUM BROMIDE 100 MG/10ML IV SOLN
INTRAVENOUS | Status: DC | PRN
  Administered 2020-05-05: 80 mg via INTRAVENOUS

## 2020-05-05 MED ORDER — MIDAZOLAM HCL 2 MG/2ML IJ SOLN: 2.0000 mg | Freq: Once | INTRAMUSCULAR | Status: DC

## 2020-05-05 MED ORDER — CEFAZOLIN SODIUM-DEXTROSE 2-4 GM/100ML-% IV SOLN
INTRAVENOUS | Status: AC
  Filled 2020-05-05: qty 100

## 2020-05-05 MED ORDER — POVIDONE-IODINE 10 % EX SWAB
2.0000 "application " | Freq: Once | CUTANEOUS | Status: AC
  Administered 2020-05-05: 2 via TOPICAL

## 2020-05-05 MED ORDER — DEXAMETHASONE SODIUM PHOSPHATE 10 MG/ML IJ SOLN
INTRAMUSCULAR | Status: DC | PRN
  Administered 2020-05-05: 10 mg via INTRAVENOUS

## 2020-05-05 MED ORDER — PROPOFOL 10 MG/ML IV BOLUS
INTRAVENOUS | Status: DC | PRN
Start: 2020-05-05 — End: 2020-05-05
  Administered 2020-05-05: 120 mg via INTRAVENOUS

## 2020-05-05 MED ORDER — MIDAZOLAM HCL 2 MG/2ML IJ SOLN
INTRAMUSCULAR | Status: AC
  Filled 2020-05-05: qty 2

## 2020-05-05 MED ORDER — BUPIVACAINE LIPOSOME 1.3 % IJ SUSP
INTRAMUSCULAR | Status: DC | PRN
  Administered 2020-05-05: 10 mL via PERINEURAL

## 2020-05-05 SURGICAL SUPPLY — 95 items
ADH SKN CLS APL DERMABOND .7 (GAUZE/BANDAGES/DRESSINGS)
AUG BASEPLATE 15DEG 25 WEDGE (Joint) ×2 IMPLANT
AUGMENT BASEPLATE 15DEG 25 WDG (Joint) ×1 IMPLANT
BIT DRILL 3.2 PERIPHERAL SCREW (BIT) ×2 IMPLANT
BLADE HEX COATED 2.75 (ELECTRODE) ×2 IMPLANT
BLADE SAW SGTL MED 73X18.5 STR (BLADE) ×2 IMPLANT
BLADE SURG 10 STRL SS (BLADE) ×2 IMPLANT
BLADE SURG 15 STRL LF DISP TIS (BLADE) ×2 IMPLANT
BLADE SURG 15 STRL SS (BLADE) ×4
BOWL SMART MIX CTS (DISPOSABLE) IMPLANT
BSPLAT GLND 15D 25 FULL WDG (Joint) ×1 IMPLANT
CLSR STERI-STRIP ANTIMIC 1/2X4 (GAUZE/BANDAGES/DRESSINGS) ×2 IMPLANT
COOLER ICEMAN CLASSIC (MISCELLANEOUS) ×2 IMPLANT
COVER BACK TABLE 60X90IN (DRAPES) ×2 IMPLANT
COVER MAYO STAND STRL (DRAPES) ×2 IMPLANT
COVER WAND RF STERILE (DRAPES) IMPLANT
DERMABOND ADVANCED (GAUZE/BANDAGES/DRESSINGS)
DERMABOND ADVANCED .7 DNX12 (GAUZE/BANDAGES/DRESSINGS) IMPLANT
DRAPE IMP U-DRAPE 54X76 (DRAPES) ×2 IMPLANT
DRAPE SURG 17X23 STRL (DRAPES) ×2 IMPLANT
DRAPE U-SHAPE 47X51 STRL (DRAPES) ×2 IMPLANT
DRAPE U-SHAPE 76X120 STRL (DRAPES) ×4 IMPLANT
DRSG AQUACEL AG ADV 3.5X 6 (GAUZE/BANDAGES/DRESSINGS) ×2 IMPLANT
DRSG MEPILEX BORDER 4X8 (GAUZE/BANDAGES/DRESSINGS) IMPLANT
DURAPREP 26ML APPLICATOR (WOUND CARE) ×2 IMPLANT
ELECT BLADE 4.0 EZ CLEAN MEGAD (MISCELLANEOUS) ×2
ELECT REM PT RETURN 9FT ADLT (ELECTROSURGICAL) ×2
ELECTRODE BLDE 4.0 EZ CLN MEGD (MISCELLANEOUS) ×1 IMPLANT
ELECTRODE REM PT RTRN 9FT ADLT (ELECTROSURGICAL) ×1 IMPLANT
GLENOSPHERE STANDARD 39 (Joint) ×2 IMPLANT
GLENOSPHERE STD 39 (Joint) ×1 IMPLANT
GLOVE BIO SURGEON STRL SZ7 (GLOVE) ×2 IMPLANT
GLOVE BIOGEL PI IND STRL 7.0 (GLOVE) ×1 IMPLANT
GLOVE BIOGEL PI INDICATOR 7.0 (GLOVE) ×1
GLOVE ORTHO TXT STRL SZ7.5 (GLOVE) ×2 IMPLANT
GLOVE SRG 8 PF TXTR STRL LF DI (GLOVE) ×1 IMPLANT
GLOVE SURG UNDER POLY LF SZ8 (GLOVE) ×2
GOWN STRL REUS W/ TWL LRG LVL3 (GOWN DISPOSABLE) ×2 IMPLANT
GOWN STRL REUS W/ TWL XL LVL3 (GOWN DISPOSABLE) ×1 IMPLANT
GOWN STRL REUS W/TWL LRG LVL3 (GOWN DISPOSABLE) ×4
GOWN STRL REUS W/TWL XL LVL3 (GOWN DISPOSABLE) ×2
GUIDE GLENOID PATIENT BLUEPRNT (MISCELLANEOUS) ×2 IMPLANT
GUIDE PIN 3X75 SHOULDER (PIN) ×2
GUIDEWIRE GLENOID 2.5X220 (WIRE) ×4 IMPLANT
HANDPIECE INTERPULSE COAX TIP (DISPOSABLE) ×2
HOOD PEEL AWAY FLYTE STAYCOOL (MISCELLANEOUS) ×6 IMPLANT
HUMERAL STEM AEQUALIS 3BX74MM (Stem) ×2 IMPLANT
IMPL REVERSE SHOULDER 0X3.5 (Shoulder) ×1 IMPLANT
IMPLANT REVERSE SHOULDER 0X3.5 (Shoulder) ×2 IMPLANT
INSERT REV KIT SHOULDER 6X39 (Screw) ×2 IMPLANT
KIT STABILIZATION SHOULDER (MISCELLANEOUS) IMPLANT
KIT TURNOVER KIT B (KITS) ×2 IMPLANT
MANIFOLD NEPTUNE II (INSTRUMENTS) ×2 IMPLANT
NS IRRIG 1000ML POUR BTL (IV SOLUTION) ×2 IMPLANT
PACK BASIN DAY SURGERY FS (CUSTOM PROCEDURE TRAY) ×2 IMPLANT
PACK SHOULDER (CUSTOM PROCEDURE TRAY) ×2 IMPLANT
PAD COLD SHLDR WRAP-ON (PAD) ×2 IMPLANT
PIN GUIDE 3X75 SHOULDER (PIN) ×1 IMPLANT
RETRIEVER SUT HEWSON (MISCELLANEOUS) IMPLANT
SCREW 5.5X26 (Screw) ×2 IMPLANT
SCREW BONE 6.5X40 SM (Screw) ×2 IMPLANT
SCREW PERIPHERAL 30 (Screw) ×6 IMPLANT
SET HNDPC FAN SPRY TIP SCT (DISPOSABLE) ×1 IMPLANT
SHEET MEDIUM DRAPE 40X70 STRL (DRAPES) ×2 IMPLANT
SLEEVE SCD COMPRESS KNEE MED (MISCELLANEOUS) ×2 IMPLANT
SLING ARM FOAM STRAP LRG (SOFTGOODS) ×2 IMPLANT
SLING ARM IMMOBILIZER LRG (SOFTGOODS) IMPLANT
SLING ARM IMMOBILIZER MED (SOFTGOODS) IMPLANT
SMARTMIX MINI TOWER (MISCELLANEOUS)
SPONGE LAP 18X18 RF (DISPOSABLE) ×4 IMPLANT
SPONGE LAP 4X18 RFD (DISPOSABLE) ×4 IMPLANT
STEM HUMERAL AEQUALIS 3BX74MM (Stem) ×1 IMPLANT
SUCTION FRAZIER HANDLE 10FR (MISCELLANEOUS) ×1
SUCTION TUBE FRAZIER 10FR DISP (MISCELLANEOUS) ×1 IMPLANT
SUPPORT WRAP ARM LG (MISCELLANEOUS) ×2 IMPLANT
SUT ETHIBOND 2 V 37 (SUTURE) ×2 IMPLANT
SUT FIBERWIRE #2 38 REV NDL BL (SUTURE)
SUT FIBERWIRE #2 38 T-5 BLUE (SUTURE)
SUT FIBERWIRE #5 38 CONV NDL (SUTURE) ×8
SUT MNCRL AB 4-0 PS2 18 (SUTURE) IMPLANT
SUT VIC AB 0 CT1 27 (SUTURE) ×2
SUT VIC AB 0 CT1 27XBRD ANBCTR (SUTURE) ×1 IMPLANT
SUT VIC AB 2-0 CT1 27 (SUTURE) ×2
SUT VIC AB 2-0 CT1 TAPERPNT 27 (SUTURE) ×1 IMPLANT
SUT VIC AB 2-0 SH 27 (SUTURE)
SUT VIC AB 2-0 SH 27XBRD (SUTURE) IMPLANT
SUTURE FIBERWR #2 38 T-5 BLUE (SUTURE) IMPLANT
SUTURE FIBERWR #5 38 CONV NDL (SUTURE) ×4 IMPLANT
SUTURE FIBERWR#2 38 REV NDL BL (SUTURE) IMPLANT
SYR EAR/ULCER 2OZ (SYRINGE) ×2 IMPLANT
TAPE STRIPS DRAPE STRL (GAUZE/BANDAGES/DRESSINGS) IMPLANT
TOWEL GREEN STERILE FF (TOWEL DISPOSABLE) ×4 IMPLANT
TOWER SMARTMIX MINI (MISCELLANEOUS) IMPLANT
TUBE CONNECTING 20X1/4 (TUBING) ×2 IMPLANT
YANKAUER SUCT BULB TIP NO VENT (SUCTIONS) ×2 IMPLANT

## 2020-05-05 NOTE — Anesthesia Procedure Notes (Addendum)
Procedure Name: Intubation Date/Time: 05/05/2020 7:59 AM Performed by: [place] Detavious Rinn C, CRNA Pre-anesthesia Checklist: Patient identified, Emergency Drugs available, Suction available and Patient being monitored Patient Re-evaluated:Patient Re-evaluated prior to induction Oxygen Delivery Method: Circle system utilized Preoxygenation: Pre-oxygenation with 100% oxygen Induction Type: IV induction Ventilation: Mask ventilation without difficulty Laryngoscope Size: Mac and 4 Grade View: Grade III Tube type: Oral Tube size: 7.5 mm Number of attempts: 1 Airway Equipment and Method: Stylet and Oral airway Placement Confirmation: ETT inserted through vocal cords under direct vision,  positive ETCO2 and breath sounds checked- equal and bilateral Secured at: 21 cm Tube secured with: Tape Dental Injury: Teeth and Oropharynx as per pre-operative assessment

## 2020-05-05 NOTE — Progress Notes (Signed)
Dr Griffin Basil 's PA  Blaine at bedside. Xray ordered and OT prior to discharge. OK for patient to go home  Per [place].

## 2020-05-05 NOTE — Progress Notes (Signed)
Occupational Therapy Evaluation Patient Details Name: Zachary Marshall MRN: [id number] DOB: November 16, 1955 Today's Date: 05/05/2020    History of Present Illness 64 yo s/p R reverse shoulder arthroplasty. PMH: arthritis, osteoarthritis of L shoulder; L TSA; L total ankle arthroplasty   Clinical Impression   Completed education regarding compensatory strategies for ADL, correct positioning of RUE,donning.doffing sling, pain control and HEP for RUE (elbow/wrist/hand ROM only) with pt/wife. Pt/wife verbalized understnading. Written instructions provided..   Pt to follow up with MD to progress therapy if needed.   Follow Up Recommendations  Follow surgeon's recommendation for DC plan and follow-up therapies    Equipment Recommendations  None recommended by OT    Recommendations for Other Services       Precautions / Restrictions Precautions Precautions: Shoulder Type of Shoulder Precautions: AROM elbow/wrist/hand only; no shoulder ROM Shoulder Interventions: Shoulder sling/immobilizer;At all times;Off for dressing/bathing/exercises Precaution Booklet Issued: Yes (comment) Required Braces or Orthoses: Sling Restrictions Weight Bearing Restrictions: Yes RUE Weight Bearing: Non weight bearing      Mobility Bed Mobility                    Transfers                      Balance                                           ADL either performed or assessed with clinical judgement   ADL Overall ADL's : Needs assistance/impaired                                     Functional mobility during ADLs: Min guard General ADL Comments: Educated pt/wife on compensatory strategies for ADL and management of sling. Pt/wife educated ot donn operative arm first adn remove last when doffing. Clothing types discussed; Pt educated on compensatory strategy for donning [place]. Educated on proper positioning of sling and donning doffing sling     Vision          Perception     Praxis      Pertinent Vitals/Pain Pain Assessment: No/denies pain (block active)     Hand Dominance Right   Extremity/Trunk Assessment Upper Extremity Assessment Upper Extremity Assessment: RUE deficits/detail RUE Deficits / Details: s/p shoulder sx; block active; PROM elbow/wrist/hand [facility]           Communication Communication Communication: No difficulties   Cognition Arousal/Alertness: Awake/alert Behavior During Therapy: WFL for tasks assessed/performed Overall Cognitive Status: Within Functional Limits for tasks assessed                                     General Comments  Educated on correct positionoing of RUE in recliner and when in bed by supporting armwith pillows under elbow and behind shoulder    Exercises  elbow/wrist/hand PROM WFL - block active; educated pt/wife on exercises - written handout provided   Shoulder Instructions      Home Living Family/patient expects to be discharged to:: Private residence Living Arrangements: Spouse/significant other Available Help at Discharge: Family Type of Home: House Home Access: Stairs to enter Technical brewer of Steps: 3 Entrance Stairs-Rails: Right Home Layout: One level  Bathroom Shower/Tub: Tub/shower unit;Walk-in shower   Bathroom Toilet: Standard     Home Equipment: Environmental consultant - 2 wheels;Cane - single point;Bedside commode;Shower seat;Grab bars - tub/shower;Hand held shower head          Prior Functioning/Environment Level of Independence: Independent        Comments: works as Medical sales representative Problem List: Decreased strength;Decreased range of motion;Decreased knowledge of precautions;Impaired UE functional use      OT Treatment/Interventions:      OT Goals(Current goals can be found in the care plan section) Acute Rehab OT Goals Patient Stated Goal: to have a useful arm; decrease pain OT Goal Formulation: All assessment and education  complete, DC therapy  OT Frequency:     Barriers to D/C:            Co-evaluation              AM-PAC OT "6 Clicks" Daily Activity     Outcome Measure Help from another person eating meals?: None Help from another person taking care of personal grooming?: A Little Help from another person toileting, which includes using toliet, bedpan, or urinal?: A Little Help from another person bathing (including washing, rinsing, drying)?: A Little Help from another person to put on and taking off regular upper body clothing?: A Little Help from another person to put on and taking off regular lower body clothing?: A Little 6 Click Score: 19   End of Session Nurse Communication: Other (comment) (clarification regardin dressing changes and showering)  Activity Tolerance: Patient tolerated treatment well Patient left: in chair;with family/visitor present  OT Visit Diagnosis: Muscle weakness (generalized) (M62.81)                Time: 7124-5809 OT Time Calculation (min): 37 min Charges:  OT General Charges $OT Visit: 1 Visit OT Evaluation $OT Eval Low Complexity: 1 Low OT Treatments $Self Care/Home Management : 8-22 mins  Maurie Boettcher, OT/L   Acute OT Clinical Specialist Acute Rehabilitation Services Pager [phone] Office [phone]   [facility] 05/05/2020, 12:35 PM

## 2020-05-05 NOTE — Discharge Instructions (Signed)
Diet: As you were doing prior to hospitalization   Shower:  May shower but keep the wounds dry, use an occlusive plastic wrap, NO SOAKING IN TUB.  If the bandage gets wet, change with a clean dry gauze.  If you have a splint on, leave the splint in place and keep the splint dry with a plastic bag.  Dressing:  You may change your dressing 3-5 days after surgery, unless you have a splint.  If you have a splint, then just leave the splint in place and we will change your bandages during your first follow-up appointment.    If you had hand or foot surgery, we will plan to remove your stitches in about 2 weeks in the office.  For all other surgeries, there are sticky tapes (steri-strips) on your wounds and all the stitches are absorbable.  Leave the steri-strips in place when changing your dressings, they will peel off with time, usually 2-3 weeks.  Activity:  Increase activity slowly as tolerated, but follow the weight bearing instructions below.  The rules on driving is that you can not be taking narcotics while you drive, and you must feel in control of the vehicle.    Weight Bearing:   No bearing weight with right arm.    To prevent constipation: you may use a stool softener such as -  Colace (over the counter) 100 mg by mouth twice a day  Drink plenty of fluids (prune juice may be helpful) and high fiber foods Miralax (over the counter) for constipation as needed.    Itching:  If you experience itching with your medications, try taking only a single pain pill, or even half a pain pill at a time.  You may take up to 10 pain pills per day, and you can also use benadryl over the counter for itching or also to help with sleep.   Precautions:  If you experience chest pain or shortness of breath - call 911 immediately for transfer to the hospital emergency department!!  If you develop a fever greater that 101 F, purulent drainage from wound, increased redness or drainage from wound, or calf pain --  Call the office at [phone]                                                Follow- Up Appointment:  Please call for an appointment to be seen in 2 weeks [place] - [phone]    NO TYLENOL PRODUCTS UNTIL 12:30    Post Anesthesia Home Care Instructions  Activity: Get plenty of rest for the remainder of the day. A responsible individual must stay with you for 24 hours following the procedure.  For the next 24 hours, DO NOT: -Drive a car -Paediatric nurse -Drink alcoholic beverages -Take any medication unless instructed by your physician -Make any legal decisions or sign important papers.  Meals: Start with liquid foods such as gelatin or soup. Progress to regular foods as tolerated. Avoid greasy, spicy, heavy foods. If nausea and/or vomiting occur, drink only clear liquids until the nausea and/or vomiting subsides. Call your physician if vomiting continues.  Special Instructions/Symptoms: Your throat may feel dry or sore from the anesthesia or the breathing tube placed in your throat during surgery. If this causes discomfort, gargle with warm salt water. The discomfort should disappear within 24 hours.  If you had  a scopolamine patch placed behind your ear for the management of post- operative nausea and/or vomiting:  1. The medication in the patch is effective for 72 hours, after which it should be removed.  Wrap patch in a tissue and discard in the trash. Wash hands thoroughly with soap and water. 2. You may remove the patch earlier than 72 hours if you experience unpleasant side effects which may include dry mouth, dizziness or visual disturbances. 3. Avoid touching the patch. Wash your hands with soap and water after contact with the patch.    Regional Anesthesia Blocks  1. Numbness or the inability to move the "blocked" extremity may last from 3-48 hours after placement. The length of time depends on the medication injected and your individual response to the medication.  If the numbness is not going away after 48 hours, call your surgeon.  2. The extremity that is blocked will need to be protected until the numbness is gone and the  Strength has returned. Because you cannot feel it, you will need to take extra care to avoid injury. Because it may be weak, you may have difficulty moving it or using it. You may not know what position it is in without looking at it while the block is in effect.  3. For blocks in the legs and feet, returning to weight bearing and walking needs to be done carefully. You will need to wait until the numbness is entirely gone and the strength has returned. You should be able to move your leg and foot normally before you try and bear weight or walk. You will need someone to be with you when you first try to ensure you do not fall and possibly risk injury.  4. Bruising and tenderness at the needle site are common side effects and will resolve in a few days.  5. Persistent numbness or new problems with movement should be communicated to the surgeon or the [place] [phone] [place] [phone]).Information for Discharge Teaching: EXPAREL (bupivacaine liposome injectable suspension)   Your surgeon or anesthesiologist gave you EXPAREL(bupivacaine) to help control your pain after surgery.   EXPAREL is a local anesthetic that provides pain relief by numbing the tissue around the surgical site.  EXPAREL is designed to release pain medication over time and can control pain for up to 72 hours.  Depending on how you respond to EXPAREL, you may require less pain medication during your recovery.  Possible side effects:  Temporary loss of sensation or ability to move in the area where bupivacaine was injected.  Nausea, vomiting, constipation  Rarely, numbness and tingling in your mouth or lips, lightheadedness, or anxiety may occur.  Call your doctor right away if you think you may be experiencing any of  these sensations, or if you have other questions regarding possible side effects.  Follow all other discharge instructions given to you by your surgeon or nurse. Eat a healthy diet and drink plenty of water or other fluids.  If you return to the hospital for any reason within 96 hours following the administration of EXPAREL, it is important for health care providers to know that you have received this anesthetic. A teal colored band has been placed on your arm with the date, time and amount of EXPAREL you have received in order to alert and inform your health care providers. Please leave this armband in place for the full 96 hours following administration, and then you may remove the band.  Reverse Total Shoulder Replacement, Care After This sheet gives you information about how to care for yourself after your procedure. Your health care provider may also give you more specific instructions. If you have problems or questions, contact your health care provider. What can I expect after the procedure? After the procedure, it is common to have:  Pain.  Stiffness. Follow these instructions at home: If you have a sling:  Wear the sling as told by your health care provider. Remove it only as told by your health care provider.  Loosen the sling if your fingers tingle, become numb, or turn cold and blue.  Keep the sling clean.  If the sling is not waterproof, do not let it get wet. Bathing  Do not take baths, swim, or use a hot tub until your health care provider approves. Ask your health care provider if you may take showers. You may only be allowed to take sponge baths for bathing.  If your sling is not waterproof, cover it with a watertight covering when you take a bath or shower.  Keep your bandage (dressing) dry until your health care provider says it can be removed. Incision care   Follow instructions from your health care provider about how to take care of your incision. Make sure  you: ? Wash your hands with soap and water before you change your bandage (dressing). If soap and water are not available, use hand sanitizer. ? Change your dressing as told by your health care provider. ? Leave stitches (sutures), skin glue, or adhesive strips in place. These skin closures may need to stay in place for 2 weeks or longer. If adhesive strip edges start to loosen and curl up, you may trim the loose edges. Do not remove adhesive strips completely unless your health care provider tells you to do that.  Check your incision every day for signs of infection. Check for: ? More redness, swelling, or pain. ? More fluid or blood. ? Warmth. ? Pus or a bad smell. Driving  Ask your health care provider when it is safe for you to drive.  Do not drive or use heavy machinery while taking prescription pain medicine.  Do not drive for 24 hours if you were given a medicine to help you relax (sedative). Activity  Return to your normal activities as told by your health care provider. Ask your health care provider what activities are safe for you.  Do shoulder exercises as told by your health care provider.  Do not lift your arm above shoulder level until your health care provider approves.  Do not make large arm movements.  Do not push or pull things until your health care provider approves.  Do not lift anything that is heavier than 5 lbs (2.3 kg) until your health care provider approves. Managing pain, stiffness, and swelling   If directed, put ice on your shoulder. ? Put ice in a plastic bag. ? Place a towel between your skin and the bag. ? Leave the ice on for 20 minutes, 2-3 times a day.  Move your fingers and hand often to avoid stiffness and to lessen swelling. General instructions  Do not use any products that contain nicotine or tobacco, such as cigarettes and e-cigarettes. These can delay bone healing. If you need help quitting, ask your health care provider.  To  prevent or treat constipation while you are taking prescription pain medicine, your health care provider may recommend that you: ? Drink enough fluid to keep  your urine clear or pale yellow. ? Take over-the-counter or prescription medicines. ? Eat foods that are high in fiber, such as fresh fruits and vegetables, whole grains, and beans. ? Limit foods that are high in fat and processed sugars, such as fried and sweet foods.  Take over-the-counter and prescription medicines only as told by your health care provider.  Keep all follow-up visits as told by your health care provider. This is important. Contact a health care provider if:  You feel nauseous or you vomit.  You are constipated. Constipation is when you have: ? Fewer bowel movements in a week than normal. ? Difficulty having a bowel movement. ? Stools that are dry, hard, or larger than normal.  Your arm tingles or feels numb.  Your pain gets worse, even after taking pain medicine.  You have more redness, swelling, or pain around your incision.  You have more fluid or blood coming from your incision.  Your incision feels warm to the touch.  You have pus or a bad smell coming from your incision.  You have a fever. Get help right away if:  Your shoulder joint moves out of place.  Your incision comes apart. This information is not intended to replace advice given to you by your health care provider. Make sure you discuss any questions you have with your health care provider. Document Revised: 12/29/2015 Document Reviewed: 11/08/2015 Elsevier Patient Education  2020 [employer].

## 2020-05-05 NOTE — Anesthesia Preprocedure Evaluation (Addendum)
Anesthesia Evaluation  Patient identified by MRN, date of birth, ID band Patient awake    Reviewed: Allergy & Precautions, NPO status , Patient's Chart, lab work & pertinent test results  History of Anesthesia Complications (+) PONV  Airway Mallampati: II  TM Distance: >3 FB Neck ROM: Full    Dental no notable dental hx. (+) Teeth Intact, Dental Advisory Given   Pulmonary neg pulmonary ROS,    Pulmonary exam normal breath sounds clear to auscultation       Cardiovascular Normal cardiovascular exam Rhythm:Regular Rate:Normal  HLD   Neuro/Psych negative neurological ROS  negative psych ROS   GI/Hepatic negative GI ROS, Neg liver ROS,   Endo/Other  negative endocrine ROS  Renal/GU negative Renal ROS  negative genitourinary   Musculoskeletal  (+) Arthritis ,   Abdominal   Peds  Hematology negative hematology ROS (+)   Anesthesia Other Findings   Reproductive/Obstetrics                            Anesthesia Physical Anesthesia Plan  ASA: II  Anesthesia Plan: General and Regional   Post-op Pain Management:  Regional for Post-op pain   Induction: Intravenous  PONV Risk Score and Plan: 3 and Midazolam, Dexamethasone, Ondansetron and TIVA  Airway Management Planned: Oral ETT and LMA  Additional Equipment:   Intra-op Plan:   Post-operative Plan: Extubation in OR  Informed Consent: I have reviewed the patients History and Physical, chart, labs and discussed the procedure including the risks, benefits and alternatives for the proposed anesthesia with the patient or authorized representative who has indicated his/her understanding and acceptance.     Dental advisory given  Plan Discussed with: CRNA  Anesthesia Plan Comments:        Anesthesia Quick Evaluation

## 2020-05-05 NOTE — Anesthesia Postprocedure Evaluation (Signed)
Anesthesia Post Note  Patient: Zachary Marshall  Procedure(s) Performed: RIGHT TOTAL SHOULDER ARTHROPLASTY (Right Shoulder)     Patient location during evaluation: PACU Anesthesia Type: Regional and General Level of consciousness: awake and alert Pain management: pain level controlled Vital Signs Assessment: post-procedure vital signs reviewed and stable Respiratory status: spontaneous breathing, nonlabored ventilation, respiratory function stable and patient connected to nasal cannula oxygen Cardiovascular status: blood pressure returned to baseline and stable Postop Assessment: no apparent nausea or vomiting Anesthetic complications: no   No complications documented.  Last Vitals:  Vitals:   05/05/20 1052 05/05/20 1145  BP:  125/76  Pulse: 65 66  Resp: 19   Temp:  36.5 C  SpO2: 98% 99%    Last Pain:  Vitals:   05/05/20 1145  TempSrc:   PainSc: 0-No pain                 Adrik Khim L Ramondo Dietze

## 2020-05-05 NOTE — Op Note (Signed)
Orthopaedic Surgery Operative Note (CSN: [id number])  Zachary Marshall  07/02/1955 Date of Surgery: 05/05/2020   Diagnoses:  Right primary glenohumeral arthritis  Procedure: Right reverse total Shoulder Arthroplasty   Operative Finding Unfortunately patient subscapularis was attritionally thin and underdeveloped and with his level of medialization we felt that it would not be successful with an anatomic total shoulder replacement. We felt that a hemiwould likely cause anterior instability as well. We felt that in this case that a reverse shoulder arthroplasty was the patient's best interest. He had robust sclerotic bone in the glenoid and the fixation was superb considering this. The humeral side was routine. We were able to pull the subscap back to get a slight amount of bone apposition however in the setting of her reverse it would help with stability however this would not of been sufficient in the setting of anatomic.  We knew due to preoperative planning that this patient would have a complex glenoid and we had planned for an anatomic however we were also ready for reverse.  Post-operative plan: The patient will be NWB in sling.  The patient will be will be discharged from PACU if continues to be stable as was plan prior to surgery.  DVT prophylaxis not indicated in this ambulatory upper extremity patient without significant risk factors.  Pain control with PRN pain medication preferring oral medicines.  Follow up plan will be scheduled in approximately 7 days for incision check and XR.  Physical therapy to start after first visit.  Implants: Tornier 25 full wedge baseplate, 40 center screw, four peripheral screws, 39 standard glenosphere, 3B stem with a zero high offset tray and a 39+6 poly-  Post-Op Diagnosis: Same Surgeons:Primary: Marchia Bond, MD Assistants: Merlene Pulling, PA-C Location: [facility] OR ROOM 2 Anesthesia: General with Exparel Interscalene Antibiotics: Ancef 2g preop,  Vancomycin 1033m locally Tourniquet time: None Estimated Blood Loss: 1094Complications: None Specimens: None Implants: Implant Name Type Inv. Item Serial No. Manufacturer Lot No. LRB No. Used Action  full wedge augment baseplate    [employer] [id number] Right 1 Implanted  GLENOSPHERE STANDARD 39 - [id number] GLENOSPHERE STANDARD 39  [employer] [id number] 1 Implanted  SCREW BONE 6.5X40 SM - [id number] SCREW BONE 6.5X40 SM  [employer]  Right 1 Implanted  SCREW PERIPHERAL 30 - [id number] SCREW PERIPHERAL 30  [employer]  Right 3 Implanted  SCREW 5.5X26 - [id number] SCREW 5.5X26  [employer]  Right 1 Implanted  HUMERAL STEM AEQUALIS 3BX74MM - [id number] HUMERAL STEM AEQUALIS 3BX74MM  [employer] [id number] 1 Implanted  INSERT REV KIT SHOULDER 6X39 - [id number] INSERT REV KIT SHOULDER 6X39  [employer] [id number] 1 Implanted  IMPLANT REVERSE SHOULDER 0X3.5 - [id number] IMPLANT REVERSE SHOULDER [id number] 1 Implanted    Indications for Surgery:   Zachary ORMONDis a 64y.o. male with end-stage glenohumeral arthritis and a previous history of a hemiarthroplasty on the contralateral side.  Benefits and risks of operative and nonoperative management were discussed prior to surgery with patient/guardian(s) and informed consent form was completed.  Infection and need for further surgery were discussed as was prosthetic stability and cuff issues.  We additionally specifically discussed risks of axillary nerve injury, infection, periprosthetic fracture, continued pain and longevity of implants prior to beginning procedure.      Procedure:   The patient was identified in the preoperative holding area where the surgical site was marked. Block  placed by anesthesia with exparel.  The patient was taken to the OR where a procedural timeout was called and the above noted anesthesia was induced.  The patient was positioned  beachchair on allen table with spider arm positioner.  Preoperative antibiotics were dosed.  The patient's right shoulder was prepped and draped in the usual sterile fashion.  A second preoperative timeout was called.      Standard deltopectoral approach was performed with a #10 blade. We dissected down to the subcutaneous tissues and the cephalic vein was taken laterally with the deltoid. Clavipectoral fascia was incised in line with the incision. Deep retractors were placed. The long of the biceps tendon was identified and there was significant tenosynovitis present.  Tenodesis was performed to the pectoralis tendon with #2 Ethibond. The remaining biceps was followed up into the rotator interval where it was released.   The subscapularis was taken down in a full thickness layer with capsule along the humeral neck extending inferiorly around the humeral head. We continued releasing the capsule directly off of the osteophytes inferiorly all the way around the corner. This allowed [place] to dislocate the humeral head.   The humeral head had evidence of severe osteoarthritic wear with full-thickness cartilage loss and exposed subchondral bone. There was significant flattening of the humeral head.   Patient subscapularis was very thin and we are worried that based on his internal rotation contracture we would not be able to get an appropriate subscapularis repair. We additionally looked at his superior cuff and it was also very thin and nutritionally torn at the leading edge of the supraspinatus. Based on this we felt reverse social arthroplasty would be beneficial.  There were osteophytes along the inferior humeral neck. The osteophytes were removed with an osteotome and a rongeur.  Osteophytes were removed with a rongeur and an osteotome and the anatomic neck was well visualized.     A humeral cutting guide was inserted down the intramedullary canal. The version was set at 20 of retroversion. Humeral  osteotomy was performed with an oscillating saw. The head fragment was passed off the back table. A starter awl was used to open the humeral canal. We next used T-handle straight sound reamers to ream up to an appropriate fit. A chisel was used to remove proximal humeral bone. We then broached starting with a size one broach and broaching up to 3 which obtained an appropriate fit. The broach handle was removed. A cut protector was placed. The broach handle was removed and a cut protector was placed. The humerus was retracted posteriorly and we turned our attention to glenoid exposure.  The subscapularis was again identified and immediately we took care to palpate the axillary nerve anteriorly and verify its position with gentle palpation as well as the tug test.  We then released the SGHL with bovie cautery prior to placing a curved mayo at the junction of the anterior glenoid well above the axillary nerve and bluntly dissecting the subscapularis from the capsule.  We then carefully protected the axillary nerve as we gently released the inferior capsule to fully mobilize the subscapularis.  An anterior deltoid retractor was then placed as well as a small Hohmann retractor superiorly.   The glenoid was inspected and had evidence of severe osteoarthritic wear with full-thickness cartilage loss and exposed subchondral bone. The remaining labrum was removed circumferentially taking great care not to disrupt the posterior capsule. There is significant posterior glenoid bone loss with a B3 glenoid.  We  used a custom 3D printing guide for this patient to position our center pin.  At this point we felt based on blueprint templating that a full wedge augment was necessary.  We began by using a full wedge guide to place our center pin as was templated.  We had good position of this pin and we proceeded with our starter center drill.  This allowed for [place] to use the 15 degree full wedge reamer obtaining circumferential  witness marks and good bone preparation for ingrowth.  At this point we proceeded with our center drill and had an intact vault.  We then drilled our center screw to a length of 40 mm.    We selected a 6.5 mm x 40 mm screw and the full wedge baseplate which was placed in the same orientation as our reaming.  We double checked that we had good apposition of the base plate to bone and then proceeded to place 3 locking screws and one nonlocking screw as is typical. Next a 39 mm glenosphere was selected and impacted onto the baseplate. The center screw was tightened.  We turned attention back to the humeral side. The cut protector was removed. We trialed with multiple size tray and polyethylene options and selected a 6 which provided good stability and range of motion without excess soft tissue tension. The offset was dialed in to match the normal anatomy. The shoulder was trialed.  There was good ROM in all planes and the shoulder was stable with no inferior translation.  The real humeral implants were opened after again confirming sizes.  The trial was removed. #5 Fiberwire x4 sutures passed through the humeral neck for subscap repair. The humeral component was press-fit obtaining a secure fit. A +0 high offset tray was selected and impacted onto the stem.  A 39+6 polyethylene liner was impacted onto the stem.  The joint was reduced and thoroughly irrigated with pulsatile lavage. Subscap was repaired back with #5 Fiberwire sutures through bone tunnels. Hemostasis was obtained. The deltopectoral interval was reapproximated with #1 Ethibond. The subcutaneous tissues were closed with 2-0 Vicryl and the skin was closed with running monocryl.    The wounds were cleaned and dried and an Aquacel dressing was placed. The drapes taken down. The arm was placed into sling with abduction pillow. Patient was awakened, extubated, and transferred to the recovery room in stable condition. There were no intraoperative  complications. The sponge, needle, and attention counts were  correct at the end of the case.    Due to the complex glenoid anatomy, preoperative knowledge of his difficult case we felt that two surgeons were necessary for the completion of this case and affect of an appropriate manner. Dr. Marchia Bond and I both were necessary to perform this case safely and effectively.

## 2020-05-05 NOTE — Progress Notes (Signed)
Assisted Dr. Lanetta Inch with right, ultrasound guided, interscalene  block. Side rails up, monitors on throughout procedure. See vital signs in flow sheet. Tolerated Procedure well.

## 2020-05-05 NOTE — Transfer of Care (Signed)
Immediate Anesthesia Transfer of Care Note  Patient: Zachary Marshall  Procedure(s) Performed: RIGHT TOTAL SHOULDER ARTHROPLASTY (Right Shoulder)  Patient Location: PACU  Anesthesia Type:GA combined with regional for post-op pain  Level of Consciousness: awake, alert  and drowsy  Airway & Oxygen Therapy: Patient Spontanous Breathing and Patient connected to face mask oxygen  Post-op Assessment: Report given to RN and Post -op Vital signs reviewed and stable  Post vital signs: Reviewed and stable  Last Vitals:  Vitals Value Taken Time  BP 106/62 05/05/20 0957  Temp    Pulse 69 05/05/20 0959  Resp 19 05/05/20 0959  SpO2 100 % 05/05/20 0959  Vitals shown include unvalidated device data.  Last Pain:  Vitals:   05/05/20 0627  TempSrc: Oral  PainSc: 7       Patients Stated Pain Goal: 3 (95/63/87 5643)  Complications: No complications documented.

## 2020-05-05 NOTE — Anesthesia Procedure Notes (Signed)
Anesthesia Regional Block: Interscalene brachial plexus block   Pre-Anesthetic Checklist: ,, timeout performed, Correct Patient, Correct Site, Correct Laterality, Correct Procedure, Correct Position, site marked, Risks and benefits discussed,  Surgical consent,  Pre-op evaluation,  At surgeon's request and post-op pain management  Laterality: Right  Prep: Maximum Sterile Barrier Precautions used, chloraprep       Needles:  Injection technique: Single-shot  Needle Type: Echogenic Stimulator Needle     Needle Length: 9cm  Needle Gauge: 22     Additional Needles:   Procedures:,,,, ultrasound used (permanent image in chart),,,,  Narrative:  Start time: 05/05/2020 6:54 AM End time: 05/05/2020 7:04 AM Injection made incrementally with aspirations every 5 mL.  Performed by: Personally  Anesthesiologist: Freddrick March, MD  Additional Notes: Monitors applied. No increased pain on injection. No increased resistance to injection. Injection made in 5cc increments. Good needle visualization. Patient tolerated procedure well.

## 2020-05-05 NOTE — Interval H&P Note (Signed)
History and Physical Interval Note:  05/05/2020 7:14 AM  Zachary Marshall  has presented today for surgery, with the diagnosis of PRIMARY OSTEOARHTRITIS RIGHT SHOULDER.  The various methods of treatment have been discussed with the patient and family. After consideration of risks, benefits and other options for treatment, the patient has consented to  Procedure(s): RIGHT TOTAL SHOULDER ARTHROPLASTY (Right)  vs. Hemiarthroplasty as a surgical intervention.  The patient's history has been reviewed, patient examined, no change in status, stable for surgery.  I have reviewed the patient's chart and labs.  Questions were answered to the patient's satisfaction.     Johnny Bridge

## 2020-05-09 ENCOUNTER — Encounter (HOSPITAL_BASED_OUTPATIENT_CLINIC_OR_DEPARTMENT_OTHER): Payer: Self-pay | Admitting: Orthopedic Surgery

## 2020-05-16 ENCOUNTER — Ambulatory Visit: Payer: BC Managed Care – PPO | Admitting: Physical Therapy

## 2020-05-17 ENCOUNTER — Ambulatory Visit: Payer: BC Managed Care – PPO | Attending: Medical | Admitting: Physical Therapy

## 2020-05-17 ENCOUNTER — Other Ambulatory Visit: Payer: Self-pay

## 2020-05-17 ENCOUNTER — Encounter: Payer: Self-pay | Admitting: Physical Therapy

## 2020-05-17 DIAGNOSIS — M6281 Muscle weakness (generalized): Secondary | ICD-10-CM | POA: Diagnosis present

## 2020-05-17 DIAGNOSIS — Z96662 Presence of left artificial ankle joint: Secondary | ICD-10-CM | POA: Diagnosis not present

## 2020-05-17 DIAGNOSIS — R269 Unspecified abnormalities of gait and mobility: Secondary | ICD-10-CM | POA: Insufficient documentation

## 2020-05-17 DIAGNOSIS — M25675 Stiffness of left foot, not elsewhere classified: Secondary | ICD-10-CM | POA: Diagnosis present

## 2020-05-17 DIAGNOSIS — M25572 Pain in left ankle and joints of left foot: Secondary | ICD-10-CM | POA: Insufficient documentation

## 2020-05-18 NOTE — Therapy (Signed)
[facility] [address] [place], [place], [address] Phone: [phone]   Fax:  [phone]  Physical Therapy Evaluation  Patient Details  Name: Zachary Marshall MRN: [id number] Date of Birth: 01-29-1956 Referring Provider (PT): Jannifer Franklin, PA-C   Encounter Date: 05/17/2020   Treatment: 1 of 9.  Recert date: [id number] to [id number]   Past Medical History:  Diagnosis Date  . Arthritis   . 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 PONV (postoperative nausea and vomiting)     Past Surgical History:  Procedure Laterality Date  . APPENDECTOMY    . HERNIA REPAIR Right   . SHOULDER HEMI-ARTHROPLASTY Left 04/21/2013   Procedure: SHOULDER HEMI-ARTHROPLASTY, left;  Surgeon: Johnny Bridge, MD;  Location: [place];  Service: Orthopedics;  Laterality: Left;  . TOTAL ANKLE ARTHROPLASTY Left 03/01/2020   at [place]  . TOTAL SHOULDER ARTHROPLASTY Right 05/05/2020   Procedure: RIGHT TOTAL SHOULDER ARTHROPLASTY;  Surgeon: Marchia Bond, MD;  Location: [place];  Service: Orthopedics;  Laterality: Right;    There were no vitals filed for this visit.      Pt. s/p L total ankle arthroplasty 11 weeks ago (1012/21). Pt. states he has recently weaned out of walking boot and currently has no ankle support/ brace. Pt. reports 3/10 L ankle pain with increase walking/ activity. Pt. also s/p R reverse total shoulder 2 weeks ago and is currently in sling with no order for PT at this time. Pt. returns for shoulder f/u tomorrow and will let PT know POC.     [facility] PT Assessment - 05/18/20 0001      Assessment   Medical Diagnosis L total ankle replacement    Referring Provider (PT) Jannifer Franklin, PA-C    Onset Date/Surgical Date 03/01/20    Next MD Visit 1/25?    Prior Therapy Pt. known well to PT clinic.      Precautions   Precautions Shoulder      Restrictions   Weight Bearing Restrictions No       Balance Screen   Has the patient fallen in the past 6 months No      Prior Function   Level of Independence Independent      Cognition   Overall Cognitive Status Within Functional Limits for tasks assessed              See flowsheet     Objective measurements completed on examination: See above findings.    See HEP  Nustep seat (#11-10) L3 B LE only (no R UE secondary to sling/ surgery) 10 min.       PT Education - 05/18/20 1913    Education Details MD protocol.  Issued visit 7 ex. (heel raises/ squats/ 4-way hip ex.)    Person(s) Educated Patient    Methods Explanation;Demonstration;Handout    Comprehension Verbalized understanding;Returned demonstration               PT Long Term Goals - 05/18/20 1926      PT LONG TERM GOAL #1   Title Pt. will increase FOTO to 70 to improve pain-free mobility.    Baseline Initial FOTO: 55    Time 4    Period Weeks    Status New    Target Date 06/14/20      PT LONG TERM GOAL #2   Title Pt. independent with HEP to increase L ankle DF to 10 deg. to improve  heel strike/toe off during gait pattern.    Baseline Decrease L ankle AROM: DF 4 deg., PF 20 deg., IV 0 deg., EV 19 deg.    Time 4    Period Weeks    Status New    Target Date 06/14/20      PT LONG TERM GOAL #3   Title Pt. able to ambulate at work with proper shoes/orthotics and no increase c/o L ankle or foot pain/limitations.    Baseline Limited great toe extension noted during gait with increase hip/knee ER to compensate to limited DF.    Time 4    Period Weeks    Status New    Target Date 06/14/20      PT LONG TERM GOAL #4   Title Pt. will ascend/ descend 4 steps with recip. pattern and no UE assist to improve safety.    Baseline Step to gait/ increase L foot/LE ER    Time 4    Period Weeks    Status New    Target Date 06/14/20              Plan - 05/18/20 1915    Clinical Impression Statement Pt. is a pleasant 64 y/o male s/p L ankle replacement  (03/01/20) after chronic L foot pain/ pes planus.  Pt. reports 3/10 L ankle and R shoulder pain.  Pt. is s/p L reverse total shoulder replacement 2 weeks ago and presents in shoulder sling.  Pt. returns to MD tomorrow for reassessment of R shoulder.  L ankle incisions are healed except 1 small scab noted in lateral incision (pt. instructed in scar massage).  FOTO: initial 55/ goal 70.  Pt. is weaning out of walking CAM boot and presents with no assistive device today.  Pt. is scheduled to return to work on Tuesday 05/24/20.  R ankle AROM WNL and B LE strength grossly 5/5 MMT except L ankle.  Decrease L ankle AROM: DF 4 deg., PF 20 deg., IV 0 deg., EV 19 deg.  Figure 8 measurement: 58.5 cm (swelling/ edema noted).  Med./lateral circumferential: 31.5 cm.  Forefoot: 24 cm.  Limited great toe extension noted during gait with increase hip/knee ER to compensate to limited DF.  See MD protocol for rehab goals.  Pt. will benefit from skilled PT services to increase L ankle AROM/ stability to improve pain-free mobility/ walking.    Stability/Clinical Decision Making Evolving/Moderate complexity    Clinical Decision Making Moderate    Rehab Potential Fair    PT Frequency 2x / week    PT Duration 4 weeks    PT Treatment/Interventions ADLs/Self Care Home Management;Cryotherapy;Moist Heat;Gait training;Stair training;Functional mobility training;Therapeutic activities;Therapeutic exercise;Balance training;Neuromuscular re-education;Manual techniques;Patient/family education;Passive range of motion    PT Next Visit Plan Manual tx. to L ankle/ metatarsals stretches/ see MD protocol.           Patient will benefit from skilled therapeutic intervention in order to improve the following deficits and impairments:  Abnormal gait,Decreased coordination,Decreased range of motion,Difficulty walking,Pain,Improper body mechanics,Impaired flexibility,Hypomobility,Decreased balance,Decreased mobility,Decreased strength,Decreased  activity tolerance  Visit Diagnosis: Status post left ankle joint replacement  Pain in left ankle and joints of left foot  Joint stiffness of left foot  Gait difficulty  Muscle weakness (generalized)     Problem List Patient Active Problem List   Diagnosis Date Noted  . Localized osteoarthritis of right shoulder 04/21/2013   Pura Spice, PT, DPT # [phone] 05/18/2020, 7:53 PM  [place]  [facility] [address] [place], [place], [address] Phone: [phone]   Fax:  [phone]  Name: KHALIF STENDER MRN: [id number] Date of Birth: 06-Mar-1956

## 2020-05-19 ENCOUNTER — Encounter: Payer: Self-pay | Admitting: Physical Therapy

## 2020-05-19 ENCOUNTER — Ambulatory Visit: Payer: BC Managed Care – PPO | Admitting: Physical Therapy

## 2020-05-19 ENCOUNTER — Other Ambulatory Visit: Payer: Self-pay

## 2020-05-19 DIAGNOSIS — Z96662 Presence of left artificial ankle joint: Secondary | ICD-10-CM

## 2020-05-19 DIAGNOSIS — M6281 Muscle weakness (generalized): Secondary | ICD-10-CM

## 2020-05-19 DIAGNOSIS — R269 Unspecified abnormalities of gait and mobility: Secondary | ICD-10-CM

## 2020-05-19 DIAGNOSIS — M25675 Stiffness of left foot, not elsewhere classified: Secondary | ICD-10-CM

## 2020-05-19 DIAGNOSIS — M25572 Pain in left ankle and joints of left foot: Secondary | ICD-10-CM

## 2020-05-19 NOTE — Therapy (Signed)
[facility] [address] [place], [place], [address] Phone: [phone]   Fax:  [phone]  Physical Therapy Treatment  Patient Details  Name: Zachary Marshall MRN: [id number] Date of Birth: 1956-03-08 Referring Provider (PT): Shanna Cisco, [place]   Encounter Date: 05/19/2020   PT End of Session - 05/19/20 1338    Visit Number 2    Number of Visits 9    Date for PT Re-Evaluation 06/14/20    Authorization - Visit Number 2    Authorization - Number of Visits 10    PT Start Time 0814    PT Stop Time 0903    PT Time Calculation (min) 49 min    Activity Tolerance Patient tolerated treatment well;Patient limited by pain    Behavior During Therapy [facility] for tasks assessed/performed           Past Medical History:  Diagnosis Date   Arthritis    Osteoarthritis of left shoulder 04/21/2013   shoulder replacement,    PONV (postoperative nausea and vomiting)     Past Surgical History:  Procedure Laterality Date   APPENDECTOMY     HERNIA REPAIR Right    SHOULDER HEMI-ARTHROPLASTY Left 04/21/2013   Procedure: SHOULDER HEMI-ARTHROPLASTY, left;  Surgeon: Eulas Post, MD;  Location: [facility];  Service: Orthopedics;  Laterality: Left;   TOTAL ANKLE ARTHROPLASTY Left 03/01/2020   at [facility]   TOTAL SHOULDER ARTHROPLASTY Right 05/05/2020   Procedure: RIGHT TOTAL SHOULDER ARTHROPLASTY;  Surgeon: Teryl Lucy, MD;  Location: [facility];  Service: Orthopedics;  Laterality: Right;    There were no vitals filed for this visit.   Subjective Assessment - 05/19/20 1337    Subjective Pt. states R sh. MD wants to wait 4 more weeks before starting PT on sh.  Pt. states L ankle ex. are going well (no new complaints).  L ankle pain is 3/10 while walking around PT clinic.    Pertinent History Pt. known to PT in past .    Limitations House hold activities;Standing;Walking    Patient Stated Goals Increase L ankle    Currently in  Pain? Yes    Pain Score 3     Pain Location Ankle    Pain Orientation Left    Pain Score 3    Pain Location Shoulder    Pain Orientation Right          There.ex.:   Nustep L 4 5 min. B LE only (warm-up)- discussed HEP/ R sh. MD appt.  Walking in //-bars: forward/ backwards/ lateral with L ankle control and cuing for midline wt. Bearing with mirror feedback.  Reviewed HEP (progressed with gastroc stretches/ heel raises, step ups and sit to stands with L foot behind R)  TG: knee flexion 30x (no increase pain), heel raises 20x.   Manual tx.:  Supine L ankle DF/PF manual stretches with static holds 10x each.  STM to L mid-distal gastroc musculature during DF stretches.    Supine L great toe extension A/PROM      PT Education - 05/19/20 1607    Education Details Updated HEP per MD protocol.    Person(s) Educated Patient    Methods Explanation;Demonstration;Handout    Comprehension Verbalized understanding;Returned demonstration               PT Long Term Goals - 05/18/20 1926      PT LONG TERM GOAL #1   Title Pt. will increase FOTO to 70 to  improve pain-free mobility.    Baseline Initial FOTO: 55    Time 4    Period Weeks    Status New    Target Date 06/14/20      PT LONG TERM GOAL #2   Title Pt. independent with HEP to increase L ankle DF to 10 deg. to improve heel strike/toe off during gait pattern.    Baseline Decrease L ankle AROM: DF 4 deg., PF 20 deg., IV 0 deg., EV 19 deg.    Time 4    Period Weeks    Status New    Target Date 06/14/20      PT LONG TERM GOAL #3   Title Pt. able to ambulate at work with proper shoes/orthotics and no increase c/o L ankle or foot pain/limitations.    Baseline Limited great toe extension noted during gait with increase hip/knee ER to compensate to limited DF.    Time 4    Period Weeks    Status New    Target Date 06/14/20      PT LONG TERM GOAL #4   Title Pt. will ascend/ descend 4 steps with recip. pattern and no  UE assist to improve safety.    Baseline Step to gait/ increase L foot/LE ER    Time 4    Period Weeks    Status New    Target Date 06/14/20                 Plan - 05/19/20 0916    Clinical Impression Statement Pt. increased L ankle DF to 8 deg. after manual stretches/ standing gastroc stretches at step.  Pt. instructed to avoid any pain provoking movement patterns and focus on gastroc stretches/ ankle stability.  See update HEP per MD protocol.  Pt. ambulates with slight L LE/ankle ER to compensate for joint stiffness.   No increase c/o pain and good technique with squats at TG and step ups.    Stability/Clinical Decision Making Evolving/Moderate complexity    Clinical Decision Making Moderate    Rehab Potential Good    PT Frequency 2x / week    PT Duration 4 weeks    PT Treatment/Interventions ADLs/Self Care Home Management;Cryotherapy;Moist Heat;Gait training;Stair training;Functional mobility training;Therapeutic activities;Therapeutic exercise;Balance training;Neuromuscular re-education;Manual techniques;Patient/family education;Passive range of motion    PT Next Visit Plan Manual tx. to L ankle/ metatarsals stretches/ see MD protocol.    PT Home Exercise Plan [email or web address]           Patient will benefit from skilled therapeutic intervention in order to improve the following deficits and impairments:  Abnormal gait,Decreased coordination,Decreased range of motion,Difficulty walking,Pain,Improper body mechanics,Impaired flexibility,Hypomobility,Decreased balance,Decreased mobility,Decreased strength,Decreased activity tolerance  Visit Diagnosis: Status post left ankle joint replacement  Pain in left ankle and joints of left foot  Joint stiffness of left foot  Gait difficulty  Muscle weakness (generalized)     Problem List Patient Active Problem List   Diagnosis Date Noted   Localized osteoarthritis of right shoulder 04/21/2013   Cammie Mcgee, PT, DPT #  [phone] 05/19/2020, 1:41 PM  [facility] [address] [place], [place], [address] Phone: [phone]   Fax:  [phone]  Name: Zachary Marshall MRN: [id number] Date of Birth: 1955/08/01

## 2020-05-19 NOTE — Patient Instructions (Signed)
Access Code: [email or web address]: [email or web address]: 12/30/2021Prepared by: Casimiro Needle SherkExercises  Standing Gastroc Stretch on Step with Counter Support - 1 x daily - 7 x weekly - 2 sets - 10 reps  Sit to Stand without Arm Support - 1 x daily - 7 x weekly - 2 sets - 10 reps  Mini Lunge - 1 x daily - 7 x weekly - 2 sets - 10 reps  Forward Step Up with Counter Support - 1 x daily - 7 x weekly - 2 sets - 10 reps  Heel rises with counter support - 1 x daily - 7 x weekly - 2 sets - 10 reps  Standing 3-Way Leg Reach with Resistance at Ankles and Unilateral Counter Support - 1 x daily - 5 x weekly - 2 sets - 10 reps  Ankle Pumps in Elevation - 1 x daily - 7 x weekly - 1 sets - 10 reps

## 2020-05-23 ENCOUNTER — Ambulatory Visit: Payer: BC Managed Care – PPO | Attending: Medical | Admitting: Physical Therapy

## 2020-05-23 ENCOUNTER — Other Ambulatory Visit: Payer: Self-pay

## 2020-05-23 ENCOUNTER — Encounter: Payer: Self-pay | Admitting: Physical Therapy

## 2020-05-23 DIAGNOSIS — M25675 Stiffness of left foot, not elsewhere classified: Secondary | ICD-10-CM | POA: Insufficient documentation

## 2020-05-23 DIAGNOSIS — M25511 Pain in right shoulder: Secondary | ICD-10-CM | POA: Insufficient documentation

## 2020-05-23 DIAGNOSIS — M25611 Stiffness of right shoulder, not elsewhere classified: Secondary | ICD-10-CM | POA: Insufficient documentation

## 2020-05-23 DIAGNOSIS — M6281 Muscle weakness (generalized): Secondary | ICD-10-CM | POA: Diagnosis present

## 2020-05-23 DIAGNOSIS — Z96611 Presence of right artificial shoulder joint: Secondary | ICD-10-CM | POA: Insufficient documentation

## 2020-05-23 DIAGNOSIS — M25572 Pain in left ankle and joints of left foot: Secondary | ICD-10-CM | POA: Insufficient documentation

## 2020-05-23 DIAGNOSIS — G8929 Other chronic pain: Secondary | ICD-10-CM | POA: Diagnosis present

## 2020-05-23 DIAGNOSIS — Z96662 Presence of left artificial ankle joint: Secondary | ICD-10-CM | POA: Diagnosis not present

## 2020-05-23 DIAGNOSIS — R269 Unspecified abnormalities of gait and mobility: Secondary | ICD-10-CM | POA: Diagnosis present

## 2020-05-23 NOTE — Therapy (Signed)
[facility] [address] [place], [place], [address] Phone: [phone]   Fax:  [phone]  Physical Therapy Treatment  Patient Details  Name: Zachary Marshall MRN: [id number] Date of Birth: [birth year] Referring Provider (PT): Jannifer Franklin, [place]   Encounter Date: 05/23/2020   PT End of Session - 05/23/20 0736    Visit Number 3    Number of Visits 9    Date for PT Re-Evaluation 06/14/20    Authorization - Visit Number 3    Authorization - Number of Visits 10    PT Start Time 0726    PT Stop Time 0820    PT Time Calculation (min) 54 min    Activity Tolerance Patient tolerated treatment well;Patient limited by pain    Behavior During Therapy [facility] for tasks assessed/performed           Past Medical History:  Diagnosis Date  . Arthritis   . 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 PONV (postoperative nausea and vomiting)     Past Surgical History:  Procedure Laterality Date  . APPENDECTOMY    . HERNIA REPAIR Right   . SHOULDER HEMI-ARTHROPLASTY Left 04/21/2013   Procedure: SHOULDER HEMI-ARTHROPLASTY, left;  Surgeon: Johnny Bridge, MD;  Location: [place];  Service: Orthopedics;  Laterality: Left;  . TOTAL ANKLE ARTHROPLASTY Left 03/01/2020   at [place]  . TOTAL SHOULDER ARTHROPLASTY Right 05/05/2020   Procedure: RIGHT TOTAL SHOULDER ARTHROPLASTY;  Surgeon: Marchia Bond, MD;  Location: [place];  Service: Orthopedics;  Laterality: Right;    There were no vitals filed for this visit.   Subjective Assessment - 05/23/20 0731    Subjective Pt. reports L ankle stiffness this morning due to weather.  L ankle pain 4/10.    Pertinent History Pt. known to PT in past .    Limitations House hold activities;Standing;Walking    Patient Stated Goals Increase L ankle    Currently in Pain? Yes    Pain Score 4     Pain Location Ankle    Pain Orientation Left    Pain Descriptors / Indicators  Aching            There.ex.:   Nustep L 5 10 min. B LE only (warm-up)- discussed HEP  Walking in //-bars (10x each): forward/ backwards/ lateral with L ankle control and cuing for midline wt. Bearing with mirror feedback.  Prostretch: 6x with static holds  TG: knee flexion 30x (no increase pain), heel raises 20x.  Single leg squats 10x2 (partial).   Manual tx.:  Supine L ankle DF/PF manual stretches with static holds 10x each.  STM to L mid-distal gastroc/plantar foot musculature during DF stretches.    Supine L great toe extension A/PROM      PT Long Term Goals - 05/18/20 1926      PT LONG TERM GOAL #1   Title Pt. will increase FOTO to 70 to improve pain-free mobility.    Baseline Initial FOTO: 55    Time 4    Period Weeks    Status New    Target Date 06/14/20      PT LONG TERM GOAL #2   Title Pt. independent with HEP to increase L ankle DF to 10 deg. to improve heel strike/toe off during gait pattern.    Baseline Decrease L ankle AROM: DF 4 deg., PF 20 deg., IV 0 deg., EV 19 deg.    Time  4    Period Weeks    Status New    Target Date 06/14/20      PT LONG TERM GOAL #3   Title Pt. able to ambulate at work with proper shoes/orthotics and no increase c/o L ankle or foot pain/limitations.    Baseline Limited great toe extension noted during gait with increase hip/knee ER to compensate to limited DF.    Time 4    Period Weeks    Status New    Target Date 06/14/20      PT LONG TERM GOAL #4   Title Pt. will ascend/ descend 4 steps with recip. pattern and no UE assist to improve safety.    Baseline Step to gait/ increase L foot/LE ER    Time 4    Period Weeks    Status New    Target Date 06/14/20              Plan - 05/23/20 0736    Clinical Impression Statement Slight increase in L lateral ankle discomfort with EV isometrics in supine position.  Pt. ambulates with improved L foot midline position and consistent heel strike in PT clinic.  Good  stretch in L gastroc/achilles during standing prostretch/ manual stretches.  Pt. doing well with current HEP and will continue with daily walking/ stretches.  No change to HEP at this time.    Stability/Clinical Decision Making Evolving/Moderate complexity    Clinical Decision Making Moderate    Rehab Potential Good    PT Frequency 2x / week    PT Duration 4 weeks    PT Treatment/Interventions ADLs/Self Care Home Management;Cryotherapy;Moist Heat;Gait training;Stair training;Functional mobility training;Therapeutic activities;Therapeutic exercise;Balance training;Neuromuscular re-education;Manual techniques;Patient/family education;Passive range of motion    PT Next Visit Plan Manual tx. to L ankle/ metatarsals stretches/ see MD protocol.  Issue schedule next tx.    PT Home Exercise Plan [email or web address]           Patient will benefit from skilled therapeutic intervention in order to improve the following deficits and impairments:  Abnormal gait,Decreased coordination,Decreased range of motion,Difficulty walking,Pain,Improper body mechanics,Impaired flexibility,Hypomobility,Decreased balance,Decreased mobility,Decreased strength,Decreased activity tolerance  Visit Diagnosis: Status post left ankle joint replacement  Pain in left ankle and joints of left foot  Joint stiffness of left foot  Gait difficulty  Muscle weakness (generalized)     Problem List Patient Active Problem List   Diagnosis Date Noted  . Localized osteoarthritis of right shoulder 04/21/2013   Cammie Mcgee, PT, DPT # [phone] 05/23/2020, 8:38 AM  [place] [facility] [address] [place], [place], [address] Phone: [phone]   Fax:  [phone]  Name: Zachary Marshall MRN: [id number] Date of Birth: [birth year]

## 2020-05-24 ENCOUNTER — Encounter: Payer: BC Managed Care – PPO | Admitting: Physical Therapy

## 2020-05-25 ENCOUNTER — Encounter: Payer: BC Managed Care – PPO | Admitting: Physical Therapy

## 2020-05-26 ENCOUNTER — Ambulatory Visit: Payer: BC Managed Care – PPO | Admitting: Physical Therapy

## 2020-05-27 ENCOUNTER — Encounter: Payer: Self-pay | Admitting: Physical Therapy

## 2020-05-27 ENCOUNTER — Ambulatory Visit: Payer: BC Managed Care – PPO | Admitting: Physical Therapy

## 2020-05-27 ENCOUNTER — Other Ambulatory Visit: Payer: Self-pay

## 2020-05-27 DIAGNOSIS — Z96662 Presence of left artificial ankle joint: Secondary | ICD-10-CM | POA: Diagnosis not present

## 2020-05-27 DIAGNOSIS — M25572 Pain in left ankle and joints of left foot: Secondary | ICD-10-CM

## 2020-05-27 DIAGNOSIS — M6281 Muscle weakness (generalized): Secondary | ICD-10-CM

## 2020-05-27 DIAGNOSIS — R269 Unspecified abnormalities of gait and mobility: Secondary | ICD-10-CM

## 2020-05-27 DIAGNOSIS — M25675 Stiffness of left foot, not elsewhere classified: Secondary | ICD-10-CM

## 2020-05-29 NOTE — Therapy (Signed)
[place] [address] [place], [place], [address] Phone: [phone]   Fax:  [phone]  Physical Therapy Treatment  Patient Details  Name: TATEM FESLER MRN: [id number] Date of Birth: November 18, 1955 Referring Provider (PT): Jannifer Franklin, [place]   Encounter Date: 05/27/2020   PT End of Session - 05/29/20 1852    Visit Number 4    Number of Visits 9    Date for PT Re-Evaluation 06/14/20    Authorization - Visit Number 4    Authorization - Number of Visits 10    PT Start Time 1111    PT Stop Time 1202    PT Time Calculation (min) 51 min    Activity Tolerance Patient tolerated treatment well;Patient limited by pain    Behavior During Therapy [facility] for tasks assessed/performed           Past Medical History:  Diagnosis Date  . Arthritis   . 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 PONV (postoperative nausea and vomiting)     Past Surgical History:  Procedure Laterality Date  . APPENDECTOMY    . HERNIA REPAIR Right   . SHOULDER HEMI-ARTHROPLASTY Left 04/21/2013   Procedure: SHOULDER HEMI-ARTHROPLASTY, left;  Surgeon: Johnny Bridge, MD;  Location: [place];  Service: Orthopedics;  Laterality: Left;  . TOTAL ANKLE ARTHROPLASTY Left 03/01/2020   at [place]  . TOTAL SHOULDER ARTHROPLASTY Right 05/05/2020   Procedure: RIGHT TOTAL SHOULDER ARTHROPLASTY;  Surgeon: Marchia Bond, MD;  Location: [place];  Service: Orthopedics;  Laterality: Right;    There were no vitals filed for this visit.    Pt. reports L ankle stiffness this morning due to weather. L ankle pain 4/10. Pt. states he does well with resisted ther.ex. at home with some difficulty with sit to stands and lunges.        There.ex.:   Scifit L5 10 min. B LE only- focus on keeping heels down (warm-up)- discussed return to work.    Walking in //-bars (10x each): forward/ backwards/ lateral with L ankle control and  cuing for midline wt. Bearing with mirror feedback.  Resisted gait: 2BTB 6x all 4-planes (min. To no UE assist)  Tandem/ SLS in //-bars  TG: knee flexion 30x (no increase pain), heel raises 20x.  Single leg squats 10x2 (partial).   Manual tx.:  Supine L ankle DF/PF manual stretches with static holds 10x each.  STM to L mid-distal gastroc/plantar foot musculature during DF stretches.   Supine L great toe extension A/PROM    PT Long Term Goals - 05/18/20 1926      PT LONG TERM GOAL #1   Title Pt. will increase FOTO to 70 to improve pain-free mobility.    Baseline Initial FOTO: 55    Time 4    Period Weeks    Status New    Target Date 06/14/20      PT LONG TERM GOAL #2   Title Pt. independent with HEP to increase L ankle DF to 10 deg. to improve heel strike/toe off during gait pattern.    Baseline Decrease L ankle AROM: DF 4 deg., PF 20 deg., IV 0 deg., EV 19 deg.    Time 4    Period Weeks    Status New    Target Date 06/14/20      PT LONG TERM GOAL #3   Title Pt. able to ambulate at work with proper shoes/orthotics and  no increase c/o L ankle or foot pain/limitations.    Baseline Limited great toe extension noted during gait with increase hip/knee ER to compensate to limited DF.    Time 4    Period Weeks    Status New    Target Date 06/14/20      PT LONG TERM GOAL #4   Title Pt. will ascend/ descend 4 steps with recip. pattern and no UE assist to improve safety.    Baseline Step to gait/ increase L foot/LE ER    Time 4    Period Weeks    Status New    Target Date 06/14/20              Plan - 05/29/20 1853    Clinical Impression Statement Pt. ambulating with improved heel strike/ toe off and able to maintain foot in a more midline position.  Pt. showing progress with L ankle AROM (around 30 deg. of DF/PF AROM in total) after stretches in supine.  Pt. continues to follow MD protocol and has returned to work but limited on plant floor due to R shoulder  sling/ limitations. L great toe discomfort with extension/ plantar stretches.    Stability/Clinical Decision Making Evolving/Moderate complexity    Clinical Decision Making Moderate    Rehab Potential Good    PT Frequency 2x / week    PT Duration 4 weeks    PT Treatment/Interventions ADLs/Self Care Home Management;Cryotherapy;Moist Heat;Gait training;Stair training;Functional mobility training;Therapeutic activities;Therapeutic exercise;Balance training;Neuromuscular re-education;Manual techniques;Patient/family education;Passive range of motion    PT Next Visit Plan Manual tx. to L ankle/ metatarsals stretches/ see MD protocol.    PT Home Exercise Plan [email or web address]           Patient will benefit from skilled therapeutic intervention in order to improve the following deficits and impairments:  Abnormal gait,Decreased coordination,Decreased range of motion,Difficulty walking,Pain,Improper body mechanics,Impaired flexibility,Hypomobility,Decreased balance,Decreased mobility,Decreased strength,Decreased activity tolerance  Visit Diagnosis: Status post left ankle joint replacement  Pain in left ankle and joints of left foot  Joint stiffness of left foot  Gait difficulty  Muscle weakness (generalized)     Problem List Patient Active Problem List   Diagnosis Date Noted  . 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

## 2020-05-30 ENCOUNTER — Encounter: Payer: BC Managed Care – PPO | Admitting: Physical Therapy

## 2020-05-31 ENCOUNTER — Encounter: Payer: BC Managed Care – PPO | Admitting: Physical Therapy

## 2020-06-01 ENCOUNTER — Ambulatory Visit: Payer: BC Managed Care – PPO | Admitting: Physical Therapy

## 2020-06-01 ENCOUNTER — Encounter: Payer: BC Managed Care – PPO | Admitting: Physical Therapy

## 2020-06-02 ENCOUNTER — Encounter: Payer: BC Managed Care – PPO | Admitting: Physical Therapy

## 2020-06-03 ENCOUNTER — Ambulatory Visit: Payer: BC Managed Care – PPO | Admitting: Physical Therapy

## 2020-06-03 ENCOUNTER — Other Ambulatory Visit: Payer: Self-pay

## 2020-06-03 DIAGNOSIS — M25572 Pain in left ankle and joints of left foot: Secondary | ICD-10-CM

## 2020-06-03 DIAGNOSIS — Z96662 Presence of left artificial ankle joint: Secondary | ICD-10-CM

## 2020-06-03 DIAGNOSIS — M25675 Stiffness of left foot, not elsewhere classified: Secondary | ICD-10-CM

## 2020-06-03 DIAGNOSIS — M6281 Muscle weakness (generalized): Secondary | ICD-10-CM

## 2020-06-03 DIAGNOSIS — R269 Unspecified abnormalities of gait and mobility: Secondary | ICD-10-CM

## 2020-06-03 NOTE — Therapy (Signed)
[facility] [address] [place], [place], [address] Phone: [phone]   Fax:  [phone]  Physical Therapy Treatment  Patient Details  Name: Zachary Marshall MRN: [id number] Date of Birth: November 07, 1955 Referring Provider (PT): Jannifer Franklin, [place]   Encounter Date: 06/03/2020   PT End of Session - 06/03/20 1213    Visit Number 5    Number of Visits 9    Date for PT Re-Evaluation 06/14/20    Authorization - Visit Number 5    Authorization - Number of Visits 10    PT Start Time 5885    PT Stop Time 1208    PT Time Calculation (min) 55 min    Activity Tolerance Patient tolerated treatment well;Patient limited by pain    Behavior During Therapy [facility] for tasks assessed/performed           Past Medical History:  Diagnosis Date  . Arthritis   . 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 PONV (postoperative nausea and vomiting)     Past Surgical History:  Procedure Laterality Date  . APPENDECTOMY    . HERNIA REPAIR Right   . SHOULDER HEMI-ARTHROPLASTY Left 04/21/2013   Procedure: SHOULDER HEMI-ARTHROPLASTY, left;  Surgeon: Johnny Bridge, MD;  Location: [place];  Service: Orthopedics;  Laterality: Left;  . TOTAL ANKLE ARTHROPLASTY Left 03/01/2020   at [place]  . TOTAL SHOULDER ARTHROPLASTY Right 05/05/2020   Procedure: RIGHT TOTAL SHOULDER ARTHROPLASTY;  Surgeon: Marchia Bond, MD;  Location: [place];  Service: Orthopedics;  Laterality: Right;    There were no vitals filed for this visit.   Subjective Assessment - 06/03/20 1120    Subjective Pt reports very little pain and normal stiffness in the L ankle today (2/10 pain).  Pt states he is having increased swelling in the L lower leg and he attributes it to standing on concrete at work for long periods. Pt states he is doing light duty at work currently. Pt states he still has some trouble with walking on uneven surfaces, but it's getting  better.    Pertinent History Pt. known to PT in past .    Limitations House hold activities;Standing;Walking    Patient Stated Goals Increase L ankle    Currently in Pain? Yes    Pain Score 2     Pain Location Ankle    Pain Orientation Left    Pain Descriptors / Indicators Aching           There.ex.:   SciFIT level 6 10 min. B LE only (warm-up)- discussed HEP  Walking in //-bars (10x each): lunges 3 laps, heel raise x30, SLS x30 sec each leg, resisted side stepping/fwd/ backward walking (2BTB), x7 laps each. L ankle control and cuing for midline wt. Bearing with mirror feedback  Airex: heel and toe raises x30, lateral weight shifts x30, tandem balance 30 sec each side, slow marching x1 minute, SL step up with 5 second hold fwd and lateral x5 each foot and easch direction      Manual tx.:  Supine L ankle DF/PF manual stretches with static holds 3x30 sec each  Supine L great toe extension A/PROMx20  Supine L ankle 4 way isometics 10 sec hold x5        PT Long Term Goals - 05/18/20 1926      PT LONG TERM GOAL #1   Title Pt. will increase FOTO to 70 to improve pain-free mobility.  Baseline Initial FOTO: 55    Time 4    Period Weeks    Status New    Target Date 06/14/20      PT LONG TERM GOAL #2   Title Pt. independent with HEP to increase L ankle DF to 10 deg. to improve heel strike/toe off during gait pattern.    Baseline Decrease L ankle AROM: DF 4 deg., PF 20 deg., IV 0 deg., EV 19 deg.    Time 4    Period Weeks    Status New    Target Date 06/14/20      PT LONG TERM GOAL #3   Title Pt. able to ambulate at work with proper shoes/orthotics and no increase c/o L ankle or foot pain/limitations.    Baseline Limited great toe extension noted during gait with increase hip/knee ER to compensate to limited DF.    Time 4    Period Weeks    Status New    Target Date 06/14/20      PT LONG TERM GOAL #4   Title Pt. will ascend/ descend 4 steps with recip.  pattern and no UE assist to improve safety.    Baseline Step to gait/ increase L foot/LE ER    Time 4    Period Weeks    Status New    Target Date 06/14/20                 Plan - 06/03/20 1208    Clinical Impression Statement Pt presents with decreased L LE balance in tandem and SLS on both even and uneven surfaces r/i difficulty with negotiating uneven surfaces. Pt demos good strength in L ankle PF and DF, but decreased inversion and eversion strength evident during manual isometrics and r/i decreased balance as well. Pt tolerated added ex well today with no c/o increased pain.  Pt. ambulates with a more midline gait without ER of L LE.    Stability/Clinical Decision Making Evolving/Moderate complexity    Clinical Decision Making Moderate    Rehab Potential Good    PT Frequency 2x / week    PT Duration 4 weeks    PT Treatment/Interventions ADLs/Self Care Home Management;Cryotherapy;Moist Heat;Gait training;Stair training;Functional mobility training;Therapeutic activities;Therapeutic exercise;Balance training;Neuromuscular re-education;Manual techniques;Patient/family education;Passive range of motion    PT Next Visit Plan Continue to increase L ankle strength and L LE balance to increase tolerance to amb on uneven surfaces.    PT Home Exercise Plan [email or web address]           Patient will benefit from skilled therapeutic intervention in order to improve the following deficits and impairments:  Abnormal gait,Decreased coordination,Decreased range of motion,Difficulty walking,Pain,Improper body mechanics,Impaired flexibility,Hypomobility,Decreased balance,Decreased mobility,Decreased strength,Decreased activity tolerance  Visit Diagnosis: Status post left ankle joint replacement  Pain in left ankle and joints of left foot  Joint stiffness of left foot  Gait difficulty  Muscle weakness (generalized)     Problem List Patient Active Problem List   Diagnosis Date Noted  .  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

## 2020-06-06 ENCOUNTER — Encounter: Payer: BC Managed Care – PPO | Admitting: Physical Therapy

## 2020-06-07 ENCOUNTER — Encounter: Payer: BC Managed Care – PPO | Admitting: Physical Therapy

## 2020-06-08 ENCOUNTER — Other Ambulatory Visit: Payer: Self-pay

## 2020-06-08 ENCOUNTER — Ambulatory Visit: Payer: BC Managed Care – PPO | Admitting: Physical Therapy

## 2020-06-08 ENCOUNTER — Encounter: Payer: Self-pay | Admitting: Physical Therapy

## 2020-06-08 ENCOUNTER — Encounter: Payer: BC Managed Care – PPO | Admitting: Physical Therapy

## 2020-06-08 DIAGNOSIS — M6281 Muscle weakness (generalized): Secondary | ICD-10-CM

## 2020-06-08 DIAGNOSIS — Z96662 Presence of left artificial ankle joint: Secondary | ICD-10-CM

## 2020-06-08 DIAGNOSIS — M25675 Stiffness of left foot, not elsewhere classified: Secondary | ICD-10-CM

## 2020-06-08 NOTE — Patient Instructions (Signed)
Access Code: [email or web address]: [email or web address]: 01/19/2022Prepared by: Legrand Como SherkExercises  Standing Gastroc Stretch on Step with Counter Support - 1 x daily - 7 x weekly - 2 sets - 10 reps  Heel rises with counter support - 1 x daily - 7 x weekly - 2 sets - 10 reps  Side Lunge with Counter Support - 1 x daily - 7 x weekly - 2 sets - 10 reps  Side Stepping with Resistance at Feet - 1 x daily - 7 x weekly - 2 sets - 10 reps  Forward and Backward Monster Walk with Resistance at Ankles and Counter Support - 1 x daily - 7 x weekly - 2 sets - 10 reps

## 2020-06-08 NOTE — Therapy (Signed)
[place] [facility] [address] [place], [place], [address] Phone: [phone]   Fax:  [phone]  Physical Therapy Treatment  Patient Details  Name: Zachary Marshall MRN: [id number] Date of Birth: May 14, 1956 Referring Provider (PT): Jannifer Franklin, [place]   Encounter Date: 06/08/2020   PT End of Session - 06/08/20 1208    Visit Number 6    Number of Visits 9    Date for PT Re-Evaluation 06/14/20    Authorization - Visit Number 6    Authorization - Number of Visits 10    PT Start Time 1110    PT Stop Time 1202    PT Time Calculation (min) 52 min    Activity Tolerance Patient tolerated treatment well;No increased pain           Past Medical History:  Diagnosis Date  . Arthritis   . 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 PONV (postoperative nausea and vomiting)     Past Surgical History:  Procedure Laterality Date  . APPENDECTOMY    . HERNIA REPAIR Right   . SHOULDER HEMI-ARTHROPLASTY Left 04/21/2013   Procedure: SHOULDER HEMI-ARTHROPLASTY, left;  Surgeon: Johnny Bridge, MD;  Location: [place];  Service: Orthopedics;  Laterality: Left;  . TOTAL ANKLE ARTHROPLASTY Left 03/01/2020   at [place]  . TOTAL SHOULDER ARTHROPLASTY Right 05/05/2020   Procedure: RIGHT TOTAL SHOULDER ARTHROPLASTY;  Surgeon: Marchia Bond, MD;  Location: [place];  Service: Orthopedics;  Laterality: Right;    There were no vitals filed for this visit.   Subjective Assessment - 06/08/20 1205    Subjective Pt states his ankle is feeling good today and he is having normal stiffness. Pt reports no increased soreness after last appointment and says his HEP is getting a little easier.    Pertinent History Pt. known to PT in past .    Limitations House hold activities;Standing;Walking    Patient Stated Goals Increase L ankle    Currently in Pain? No/denies    Pain Score 0-No pain            There.ex.:    SciFIT level6 11min. B LE only (warm-up)- took subjective   Airex: heel and toe raises x2', slow marching x2', SL step up with 5 second hold fwd and lateral x5 each foot and each direction   //bar: lateral lunge x10 each side   Blue band: side stepping x20 each direction, and fwd and backward monster walks x20 each   See new HEP   Manual tx.:  Supine L ankle DF/PF manual stretches with static holds 3x30 sec each  Supine L great toe extension A/PROMx20  Supine L ankle 4 way isometics 10 sec hold x5       PT Education - 06/08/20 1207    Education Details Updated HEP today    Person(s) Educated Patient    Methods Explanation;Demonstration;Handout    Comprehension Verbalized understanding;Returned demonstration               PT Long Term Goals - 05/18/20 1926      PT LONG TERM GOAL #1   Title Pt. will increase FOTO to 70 to improve pain-free mobility.    Baseline Initial FOTO: 55    Time 4    Period Weeks    Status New    Target Date 06/14/20      PT LONG TERM GOAL #2   Title Pt. independent with HEP  to increase L ankle DF to 10 deg. to improve heel strike/toe off during gait pattern.    Baseline Decrease L ankle AROM: DF 4 deg., PF 20 deg., IV 0 deg., EV 19 deg.    Time 4    Period Weeks    Status New    Target Date 06/14/20      PT LONG TERM GOAL #3   Title Pt. able to ambulate at work with proper shoes/orthotics and no increase c/o L ankle or foot pain/limitations.    Baseline Limited great toe extension noted during gait with increase hip/knee ER to compensate to limited DF.    Time 4    Period Weeks    Status New    Target Date 06/14/20      PT LONG TERM GOAL #4   Title Pt. will ascend/ descend 4 steps with recip. pattern and no UE assist to improve safety.    Baseline Step to gait/ increase L foot/LE ER    Time 4    Period Weeks    Status New    Target Date 06/14/20               Plan - 06/08/20 1209    Clinical  Impression Statement Pt continues to demo deficits in L LE SL balance evident during balance exercises. Pt struggled with side lunging form and require both tactile and verbal cues for correct and pt was compliant. Deficits in balance tasks r/i pt feeling unsteady during amb on unstable surfaces.    Stability/Clinical Decision Making Evolving/Moderate complexity    Clinical Decision Making Moderate    Rehab Potential Good    PT Frequency 2x / week    PT Duration 4 weeks    PT Treatment/Interventions ADLs/Self Care Home Management;Cryotherapy;Moist Heat;Gait training;Stair training;Functional mobility training;Therapeutic activities;Therapeutic exercise;Balance training;Neuromuscular re-education;Manual techniques;Patient/family education;Passive range of motion    PT Next Visit Plan Assess how pt tolerated progression of exercises and HEP. Continue to increase L LE strength, stability, and balance to ease amb on uneven surfaces.    PT Home Exercise Plan [email or web address]           Patient will benefit from skilled therapeutic intervention in order to improve the following deficits and impairments:  Abnormal gait,Decreased coordination,Decreased range of motion,Difficulty walking,Pain,Improper body mechanics,Impaired flexibility,Hypomobility,Decreased balance,Decreased mobility,Decreased strength,Decreased activity tolerance  Visit Diagnosis: Status post left ankle joint replacement  Joint stiffness of left foot  Muscle weakness (generalized)     Problem List Patient Active Problem List   Diagnosis Date Noted  . 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 [place], [place], [address] Phone: [phone]   Fax:  [phone]  Name: Zachary Marshall MRN: [id number] Date of Birth: Sep 30, 1955

## 2020-06-09 ENCOUNTER — Encounter: Payer: BC Managed Care – PPO | Admitting: Physical Therapy

## 2020-06-13 ENCOUNTER — Encounter: Payer: BC Managed Care – PPO | Admitting: Physical Therapy

## 2020-06-14 ENCOUNTER — Encounter: Payer: BC Managed Care – PPO | Admitting: Physical Therapy

## 2020-06-15 ENCOUNTER — Encounter: Payer: BC Managed Care – PPO | Admitting: Physical Therapy

## 2020-06-16 ENCOUNTER — Encounter: Payer: BC Managed Care – PPO | Admitting: Physical Therapy

## 2020-06-20 ENCOUNTER — Encounter: Payer: BC Managed Care – PPO | Admitting: Physical Therapy

## 2020-06-20 ENCOUNTER — Ambulatory Visit: Payer: BC Managed Care – PPO | Admitting: Physical Therapy

## 2020-06-20 ENCOUNTER — Other Ambulatory Visit: Payer: Self-pay

## 2020-06-20 DIAGNOSIS — G8929 Other chronic pain: Secondary | ICD-10-CM

## 2020-06-20 DIAGNOSIS — Z96662 Presence of left artificial ankle joint: Secondary | ICD-10-CM | POA: Diagnosis not present

## 2020-06-20 DIAGNOSIS — Z96611 Presence of right artificial shoulder joint: Secondary | ICD-10-CM

## 2020-06-20 DIAGNOSIS — M25611 Stiffness of right shoulder, not elsewhere classified: Secondary | ICD-10-CM

## 2020-06-20 DIAGNOSIS — M6281 Muscle weakness (generalized): Secondary | ICD-10-CM

## 2020-06-20 NOTE — Patient Instructions (Signed)
Access Code: [email or web address]: [email or web address]: 01/31/2022Prepared by: Legrand Como SherkExercises  Seated Shoulder Flexion AAROM with Pulley Behind - 2 x daily - 7 x weekly - 2 sets - 15 reps  Seated Shoulder Abduction AAROM with Pulley Behind - 2 x daily - 7 x weekly - 2 sets - 15 reps  Standing Elbow Flexion Extension AROM - 1 x daily - 7 x weekly - 2 sets - 15 reps  Circular Shoulder Pendulum with Table Support - 1 x daily - 7 x weekly - 1 sets - 10 reps  Standing Shoulder Abduction AAROM with Dowel - 2 x daily - 7 x weekly - 2 sets - 10 reps  Standing Shoulder External Rotation AAROM with Dowel - 2 x daily - 7 x weekly - 2 sets - 10 reps  Standing Shoulder Flexion AAROM with Dowel - 2 x daily - 7 x weekly - 2 sets - 10 reps

## 2020-06-21 ENCOUNTER — Encounter: Payer: Self-pay | Admitting: Physical Therapy

## 2020-06-21 NOTE — Therapy (Signed)
[facility] [place] [address] [place], [place], [address] Phone: [phone]   Fax:  [phone]  Physical Therapy Evaluation  Patient Details  Name: GID SCHOFFSTALL MRN: [id number] Date of Birth: Aug 03, 1955 Referring Provider (PT): Dr. Mardelle Matte   Encounter Date: 06/20/2020   PT End of Session - 06/21/20 0936    Visit Number 1    Number of Visits 17    Date for PT Re-Evaluation 08/15/20    Authorization - Visit Number 1    Authorization - Number of Visits 10    PT Start Time 1114    PT Stop Time 1202    PT Time Calculation (min) 48 min    Activity Tolerance Patient tolerated treatment well           Past Medical History:  Diagnosis Date  . Arthritis   . 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 PONV (postoperative nausea and vomiting)     Past Surgical History:  Procedure Laterality Date  . APPENDECTOMY    . HERNIA REPAIR Right   . SHOULDER HEMI-ARTHROPLASTY Left 04/21/2013   Procedure: SHOULDER HEMI-ARTHROPLASTY, left;  Surgeon: Johnny Bridge, MD;  Location: [place];  Service: Orthopedics;  Laterality: Left;  . TOTAL ANKLE ARTHROPLASTY Left 03/01/2020   at [place]  . TOTAL SHOULDER ARTHROPLASTY Right 05/05/2020   Procedure: RIGHT TOTAL SHOULDER ARTHROPLASTY;  Surgeon: Marchia Bond, MD;  Location: [place];  Service: Orthopedics;  Laterality: Right;    There were no vitals filed for this visit.    Subjective Assessment - 06/21/20 0804    Subjective Pt. states he has been happy with L ankle recovery.  Pt. will be discharged from L ankle PT and complete HEP on an independent basis.  Pt. wants to focus on R shoulder rehab.  See MD order.  Pt. has weaned off the sling last week and started using pulley ex. at home.  S/p R reverse total shoulder replacment 05/05/20.    Pertinent History Pt. known to PT in past .    Limitations House hold activities;Standing;Walking;Lifting    Patient  Stated Goals Increase R shoulder AROM/ stability to improve functional mobility.    Currently in Pain? Yes    Pain Score 2     Pain Location Shoulder    Pain Orientation Right    Pain Descriptors / Indicators Tightness;Aching    Pain Type Surgical pain;Chronic pain              [facility] PT Assessment - 06/21/20 0001      Assessment   Medical Diagnosis S/p R reverse total shoulder replacement    Referring Provider (PT) Dr. Mardelle Matte    Onset Date/Surgical Date 05/05/20    Prior Therapy Pt. known well to PT clinic.      Precautions   Precautions Shoulder      Prior Function   Level of Independence Independent      Cognition   Overall Cognitive Status Within Functional Limits for tasks assessed             See flowsheet    Objective measurements completed on examination: See above findings.     See HEP/ pt. Has his own pulleys    PT Education - 06/21/20 0936    Education Details See HEP/ pt. already had shoulder pulleys    Person(s) Educated Patient    Methods Explanation;Demonstration;Handout    Comprehension Verbalized understanding;Returned demonstration  PT Long Term Goals - 06/21/20 1004      PT LONG TERM GOAL #1   Title Pt. will increase FOTO to 69 to improve pain-free mobility.    Baseline Initial FOTO: 55    Time 8    Period Weeks    Status New    Target Date 08/15/20      PT LONG TERM GOAL #2   Title Pt. independent with HEP to increase seated R shoulder flexion/ abduction to [facility] as compared to L shoulder to improve overhead reaching.    Baseline Supine L/R shoulder AROM: flexion (166/129 deg.), abduction (162/90 deg.)- increase sh. discomfort/ joint stiffness, ER (52/7 deg.), IR (70/ 59 deg.). Seated R shoulder AROM: flexion 122 deg./ abduction 97 deg.    Time 8    Period Weeks    Status New    Target Date 08/15/20      PT LONG TERM GOAL #3   Title Pt. will increase R shoulder strength to grossly 4+/5 MMT to improve lifting/  carrying tasks at home and work.    Baseline TBD when appropriate    Time 8    Period Weeks    Status New    Target Date 08/15/20      PT LONG TERM GOAL #4   Title Pt. able to complete work tasks with no pain or limitations to improve mobility.    Baseline Pt. limited with work tasks    Time 8    Period Weeks    Status New    Target Date 08/15/20                  Plan - 06/21/20 9767    Clinical Impression Statement Pt. is a pleasant 65 y/o male s/p R reverse shoulder replacement on 05/05/2020.  Pt. reports minimal (2/10) R shoulder pain at rest.  Pt. entered PT with no sling and recently weaned off sling last week.  Pt. reports good compliance with R shoulder in sling over past 6 weeks.  Good incision healing with no tenderness reported.  Supine L/R shoulder AROM: flexion (166/129 deg.), abduction (162/90 deg.)- increase sh. discomfort/ joint stiffness, ER (52/7 deg.), IR (70/ 59 deg.).  Seated R shoulder AROM: flexion 122 deg./ abduction 97 deg.  No MMT today.  FOTO: initial 55/ goal 69.  Grip strength: L 93.8#/ R 82.1#.  Pt. will benefit from skilled PT services to increase R shoulder AROM/ strength to improve pain-free mobility/ overhead reaching.    Stability/Clinical Decision Making Evolving/Moderate complexity    Clinical Decision Making Moderate    Rehab Potential Good    PT Frequency 2x / week    PT Duration 8 weeks    PT Treatment/Interventions ADLs/Self Care Home Management;Cryotherapy;Moist Heat;Gait training;Stair training;Functional mobility training;Therapeutic activities;Therapeutic exercise;Balance training;Neuromuscular re-education;Manual techniques;Patient/family education;Passive range of motion;Electrical Stimulation;Scar mobilization    PT Next Visit Plan Increase R shoulder AROM/ strengthening.    PT [place]           Patient will benefit from skilled therapeutic intervention in order to improve the following deficits and  impairments:  Abnormal gait,Decreased coordination,Decreased range of motion,Difficulty walking,Pain,Improper body mechanics,Impaired flexibility,Hypomobility,Decreased balance,Decreased mobility,Decreased strength,Decreased activity tolerance,Postural dysfunction,Impaired UE functional use  Visit Diagnosis: Status post reverse total shoulder replacement, right  Shoulder joint stiffness, right  Muscle weakness (generalized)  Chronic right shoulder pain     Problem List Patient Active Problem List   Diagnosis Date Noted  . Localized osteoarthritis of right shoulder  04/21/2013   Pura Spice, PT, DPT # [phone] 06/21/2020, 10:10 AM  [place] [address] [place], [place], [address] Phone: [phone]   Fax:  [phone]  Name: KRISHAUN NOOR MRN: [id number] Date of Birth: 08/13/1955

## 2020-06-22 ENCOUNTER — Other Ambulatory Visit: Payer: Self-pay

## 2020-06-22 ENCOUNTER — Ambulatory Visit: Payer: BC Managed Care – PPO | Attending: Medical | Admitting: Physical Therapy

## 2020-06-22 ENCOUNTER — Encounter: Payer: BC Managed Care – PPO | Admitting: Physical Therapy

## 2020-06-22 ENCOUNTER — Encounter: Payer: Self-pay | Admitting: Physical Therapy

## 2020-06-22 DIAGNOSIS — M25611 Stiffness of right shoulder, not elsewhere classified: Secondary | ICD-10-CM | POA: Insufficient documentation

## 2020-06-22 DIAGNOSIS — Z96611 Presence of right artificial shoulder joint: Secondary | ICD-10-CM | POA: Insufficient documentation

## 2020-06-22 DIAGNOSIS — M6281 Muscle weakness (generalized): Secondary | ICD-10-CM | POA: Diagnosis present

## 2020-06-22 DIAGNOSIS — G8929 Other chronic pain: Secondary | ICD-10-CM | POA: Diagnosis present

## 2020-06-22 DIAGNOSIS — M25511 Pain in right shoulder: Secondary | ICD-10-CM | POA: Insufficient documentation

## 2020-06-22 NOTE — Therapy (Addendum)
[place] [facility] [address] [place], [place], [address] Phone: [phone]   Fax:  [phone]  Physical Therapy Treatment  Patient Details  Name: Zachary Marshall MRN: [id number] Date of Birth: 11-17-1955 Referring Provider (PT): Dr. Mardelle Matte   Encounter Date: 06/22/2020   PT End of Session - 06/22/20 1253    Visit Number 2    Number of Visits 17    Date for PT Re-Evaluation 08/15/20    Authorization - Visit Number 2    Authorization - Number of Visits 10    PT Start Time 1109    PT Stop Time 1155    PT Time Calculation (min) 46 min    Activity Tolerance Patient tolerated treatment well           Past Medical History:  Diagnosis Date  . Arthritis   . 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 PONV (postoperative nausea and vomiting)     Past Surgical History:  Procedure Laterality Date  . APPENDECTOMY    . HERNIA REPAIR Right   . SHOULDER HEMI-ARTHROPLASTY Left 04/21/2013   Procedure: SHOULDER HEMI-ARTHROPLASTY, left;  Surgeon: Johnny Bridge, MD;  Location: [place];  Service: Orthopedics;  Laterality: Left;  . TOTAL ANKLE ARTHROPLASTY Left 03/01/2020   at [place]  . TOTAL SHOULDER ARTHROPLASTY Right 05/05/2020   Procedure: RIGHT TOTAL SHOULDER ARTHROPLASTY;  Surgeon: Marchia Bond, MD;  Location: [place];  Service: Orthopedics;  Laterality: Right;    There were no vitals filed for this visit.   Subjective Assessment - 06/22/20 1232    Subjective Pt. entered PT with no new complaints.  Pt. reports compliance with HEP/ use of pulley ex. this morning.    Pertinent History Pt. known to PT in past .    Limitations House hold activities;Standing;Walking;Lifting    Patient Stated Goals Increase R shoulder AROM/ stability to improve functional mobility.    Currently in Pain? No/denies            There.ex.:  Seated B UBE 3 min. F/b (warm-up/ discussed HEP)  Seated/ standing R  shoulder AAROM (all planes)- mirror feedback 10x each.  Reviewed HEP (seated wand chest press/ sh. Flexion/ extension/ IR)- 20x each (as tolerated)  GTB scap. Retraction 20x/ tricep ex. 20x.   Bicep curls with 2# wt. With focus on keeping R UE in midline position (mirror feedback)- 10x2.  See new HEP.    Manual tx.:  STM to R anterior shoulder/ proximal biceps in sitting.  R shoulder PROM (ER/ flexion/ abduction)- 10x as tolerated.       PT Education - 06/22/20 1233    Education Details Added scap. retraction/ bicep curls/ tricep extension (very light resistance with proper technique).    Person(s) Educated Patient    Methods Explanation;Demonstration;Handout    Comprehension Verbalized understanding;Returned demonstration               PT Long Term Goals - 06/21/20 1004      PT LONG TERM GOAL #1   Title Pt. will increase FOTO to 69 to improve pain-free mobility.    Baseline Initial FOTO: 55    Time 8    Period Weeks    Status New    Target Date 08/15/20      PT LONG TERM GOAL #2   Title Pt. independent with HEP to increase seated R shoulder flexion/ abduction to [facility] as compared to L shoulder to improve  overhead reaching.    Baseline Supine L/R shoulder AROM: flexion (166/129 deg.), abduction (162/90 deg.)- increase sh. discomfort/ joint stiffness, ER (52/7 deg.), IR (70/ 59 deg.). Seated R shoulder AROM: flexion 122 deg./ abduction 97 deg.    Time 8    Period Weeks    Status New    Target Date 08/15/20      PT LONG TERM GOAL #3   Title Pt. will increase R shoulder strength to grossly 4+/5 MMT to improve lifting/ carrying tasks at home and work.    Baseline TBD when appropriate    Time 8    Period Weeks    Status New    Target Date 08/15/20      PT LONG TERM GOAL #4   Title Pt. able to complete work tasks with no pain or limitations to improve mobility.    Baseline Pt. limited with work tasks    Time 8    Period Weeks    Status New    Target Date 08/15/20               Plan - 06/22/20 1234    Clinical Impression Statement Good technique with HEP/ wand AAROM with mirror feedback.  Pt. understands importance of preventing R UT compensation and limiting painful movement patterns at this time.  PT issued light resistive ex. for scapular muscles/ triceps in available range.  No tenderness with palpation over R anterior shoulder/ proximal biceps.    Stability/Clinical Decision Making Evolving/Moderate complexity    Clinical Decision Making Moderate    Rehab Potential Good    PT Frequency 2x / week    PT Duration 8 weeks    PT Treatment/Interventions ADLs/Self Care Home Management;Cryotherapy;Moist Heat;Gait training;Stair training;Functional mobility training;Therapeutic activities;Therapeutic exercise;Balance training;Neuromuscular re-education;Manual techniques;Patient/family education;Passive range of motion;Electrical Stimulation;Scar mobilization    PT Next Visit Plan Increase R shoulder AROM/ strengthening.    PT [place]           Patient will benefit from skilled therapeutic intervention in order to improve the following deficits and impairments:  Abnormal gait,Decreased coordination,Decreased range of motion,Difficulty walking,Pain,Improper body mechanics,Impaired flexibility,Hypomobility,Decreased balance,Decreased mobility,Decreased strength,Decreased activity tolerance,Postural dysfunction,Impaired UE functional use  Visit Diagnosis: Status post reverse total shoulder replacement, right  Shoulder joint stiffness, right  Muscle weakness (generalized)  Chronic right shoulder pain     Problem List Patient Active Problem List   Diagnosis Date Noted  . Localized osteoarthritis of right shoulder 04/21/2013   Pura Spice, PT, DPT # [id number] 06/22/2020, 12:54 PM  [place] [facility] [address] [place], [place], [address] Phone: [phone]   Fax:   [phone]  Name: Zachary Marshall MRN: [id number] Date of Birth: 1955-07-08

## 2020-06-22 NOTE — Patient Instructions (Signed)
Access Code: [email or web address]: 02/02/2022Prepared by: Legrand Como SherkExercises  Scapular Retraction with Resistance - 1 x daily - 5 x weekly - 2 sets - 10 reps  Standing Elbow Extension with Anchored Resistance - 1 x daily - 5 x weekly - 2 sets - 10 reps  Standing Bicep Curls Supinated with Dumbbells - 1 x daily - 5 x weekly - 2 sets - 10 reps

## 2020-06-27 ENCOUNTER — Ambulatory Visit: Payer: BC Managed Care – PPO | Admitting: Physical Therapy

## 2020-06-27 ENCOUNTER — Encounter: Payer: BC Managed Care – PPO | Admitting: Physical Therapy

## 2020-06-29 ENCOUNTER — Ambulatory Visit: Payer: BC Managed Care – PPO | Admitting: Physical Therapy

## 2020-06-29 ENCOUNTER — Encounter: Payer: BC Managed Care – PPO | Admitting: Physical Therapy

## 2020-06-29 ENCOUNTER — Other Ambulatory Visit: Payer: Self-pay

## 2020-06-29 DIAGNOSIS — M25611 Stiffness of right shoulder, not elsewhere classified: Secondary | ICD-10-CM

## 2020-06-29 DIAGNOSIS — G8929 Other chronic pain: Secondary | ICD-10-CM

## 2020-06-29 DIAGNOSIS — M25511 Pain in right shoulder: Secondary | ICD-10-CM

## 2020-06-29 DIAGNOSIS — Z96611 Presence of right artificial shoulder joint: Secondary | ICD-10-CM | POA: Diagnosis not present

## 2020-06-29 DIAGNOSIS — M6281 Muscle weakness (generalized): Secondary | ICD-10-CM

## 2020-06-29 NOTE — Therapy (Signed)
[place] [facility] [address] [place], [place], [address] Phone: [phone]   Fax:  [phone]  Physical Therapy Treatment  Patient Details  Name: Zachary Marshall MRN: [id number] Date of Birth: [birth year] Referring Provider (PT): Dr. 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   shoulder replacement,    PONV (postoperative nausea and vomiting)     Past Surgical History:  Procedure Laterality Date   APPENDECTOMY     HERNIA REPAIR Right    SHOULDER HEMI-ARTHROPLASTY Left 04/21/2013   Procedure: SHOULDER HEMI-ARTHROPLASTY, left;  Surgeon: Johnny Bridge, MD;  Location: [place];  Service: Orthopedics;  Laterality: Left;   TOTAL ANKLE ARTHROPLASTY Left 03/01/2020   at [place] Right 05/05/2020   Procedure: RIGHT TOTAL SHOULDER ARTHROPLASTY;  Surgeon: Marchia Bond, MD;  Location: [place];  Service: Orthopedics;  Laterality: Right;    There were no vitals filed for this visit.   Subjective Assessment - 06/30/20 0813    Subjective Pt. apologized for missing last PT appt. (was busy at work).  Pt. reports difficulty with R shoulder when try to comb/wash hair.    Pertinent History Pt. known to PT in past .    Limitations House hold activities;Standing;Walking;Lifting    Patient Stated Goals Increase R shoulder AROM/ stability to improve functional mobility.    Currently in Pain? Yes    Pain Score 2     Pain  Location Shoulder    Pain Orientation Right            There.ex.:  Seated B UBE 2.5 min. F/b (warm-up/ discussed HEP)  Standing R shoulder AAROM with wt. wand (all planes)- mirror feedback 10x each.  Reviewed HEP    Nautilus: 40# seated lat. Pull downs/ 20# B shoulder adduction/ 30# tricep extension/ 40# scap. Retraction/ 10# sh. IR/ER (difficulty with ER)- 20x.    Manual tx.:  STM to R anterior shoulder/ proximal biceps in sitting.  R shoulder PROM (ER/ flexion/ abduction)- 10x as tolerated.      PT Long Term Goals - 06/21/20 1004      PT LONG TERM GOAL #1   Title Pt. will increase FOTO to 69 to improve pain-free mobility.    Baseline Initial FOTO: 55    Time 8    Period Weeks    Status New    Target Date 08/15/20      PT LONG TERM GOAL #2   Title Pt. independent with HEP to increase seated R shoulder flexion/ abduction to [facility] as compared to L shoulder to improve overhead reaching.    Baseline Supine L/R shoulder AROM: flexion (166/129 deg.), abduction (162/90 deg.)- increase sh. discomfort/ joint stiffness, ER (52/7 deg.), IR (70/ 59 deg.). Seated R shoulder AROM: flexion 122 deg./ abduction 97 deg.  Time 8    Period Weeks    Status New    Target Date 08/15/20      PT LONG TERM GOAL #3   Title Pt. will increase R shoulder strength to grossly 4+/5 MMT to improve lifting/ carrying tasks at home and work.    Baseline TBD when appropriate    Time 8    Period Weeks    Status New    Target Date 08/15/20      PT LONG TERM GOAL #4   Title Pt. able to complete work tasks with no pain or limitations to improve mobility.    Baseline Pt. limited with work tasks    Time 8    Period Weeks    Status New    Target Date 08/15/20               Plan - 06/30/20 7867    Clinical Impression Statement R shoulder joint stiffness with flexion/ ER limit overhead/ ADL tasks.  Pt. progressing with AROM and light resistive ex. in a pain tolerable range.  Pt. will  continue to benefit from consistent ROM/ stretching ex. with a focus on UE/scapular strengthening.  No increase c/o pain after tx. but fatigue/ muscle fasciculations.    Stability/Clinical Decision Making Evolving/Moderate complexity    Clinical Decision Making Moderate    Rehab Potential Good    PT Frequency 2x / week    PT Duration 8 weeks    PT Treatment/Interventions ADLs/Self Care Home Management;Cryotherapy;Moist Heat;Gait training;Stair training;Functional mobility training;Therapeutic activities;Therapeutic exercise;Balance training;Neuromuscular re-education;Manual techniques;Patient/family education;Passive range of motion;Electrical Stimulation;Scar mobilization    PT Next Visit Plan Increase R shoulder AROM/ strengthening.    PT [place]           Patient will benefit from skilled therapeutic intervention in order to improve the following deficits and impairments:  Abnormal gait,Decreased coordination,Decreased range of motion,Difficulty walking,Pain,Improper body mechanics,Impaired flexibility,Hypomobility,Decreased balance,Decreased mobility,Decreased strength,Decreased activity tolerance,Postural dysfunction,Impaired UE functional use  Visit Diagnosis: Status post reverse total shoulder replacement, right  Shoulder joint stiffness, right  Muscle weakness (generalized)  Chronic right shoulder pain     Problem List Patient Active Problem List   Diagnosis Date Noted   Localized osteoarthritis of right shoulder 04/21/2013   Pura Spice, PT, DPT # [phone] 06/30/2020, 9:36 AM  [facility] [address] [place], [place], [address] Phone: [phone]   Fax:  [phone]  Name: REASON HELZER MRN: [id number] Date of Birth: 09/29/1955

## 2020-06-30 ENCOUNTER — Encounter: Payer: Self-pay | Admitting: Physical Therapy

## 2020-07-04 ENCOUNTER — Encounter: Payer: BC Managed Care – PPO | Admitting: Physical Therapy

## 2020-07-06 ENCOUNTER — Ambulatory Visit: Payer: BC Managed Care – PPO | Admitting: Physical Therapy

## 2020-07-06 ENCOUNTER — Encounter: Payer: BC Managed Care – PPO | Admitting: Physical Therapy

## 2020-07-11 ENCOUNTER — Encounter: Payer: BC Managed Care – PPO | Admitting: Physical Therapy

## 2020-07-13 ENCOUNTER — Other Ambulatory Visit: Payer: Self-pay

## 2020-07-13 ENCOUNTER — Ambulatory Visit: Payer: BC Managed Care – PPO | Admitting: Physical Therapy

## 2020-07-13 ENCOUNTER — Encounter: Payer: Self-pay | Admitting: Physical Therapy

## 2020-07-13 DIAGNOSIS — M25611 Stiffness of right shoulder, not elsewhere classified: Secondary | ICD-10-CM

## 2020-07-13 DIAGNOSIS — G8929 Other chronic pain: Secondary | ICD-10-CM

## 2020-07-13 DIAGNOSIS — Z96611 Presence of right artificial shoulder joint: Secondary | ICD-10-CM | POA: Diagnosis not present

## 2020-07-13 DIAGNOSIS — M6281 Muscle weakness (generalized): Secondary | ICD-10-CM

## 2020-07-13 NOTE — Therapy (Signed)
[place] [facility] [address] [place], [place], [address] Phone: [phone]   Fax:  [phone]  Physical Therapy Treatment  Patient Details  Name: Zachary Marshall MRN: [id number] Date of Birth: 06/22/1955 Referring Provider (PT): Dr. 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/2013   shoulder replacement,    PONV (postoperative nausea and vomiting)     Past Surgical History:  Procedure Laterality Date   APPENDECTOMY     HERNIA REPAIR Right    SHOULDER HEMI-ARTHROPLASTY Left 04/21/2013   Procedure: SHOULDER HEMI-ARTHROPLASTY, left;  Surgeon: Johnny Bridge, MD;  Location: [place];  Service: Orthopedics;  Laterality: Left;   TOTAL ANKLE ARTHROPLASTY Left 03/01/2020   at [place] Right 05/05/2020   Procedure: RIGHT TOTAL SHOULDER ARTHROPLASTY;  Surgeon: Marchia Bond, MD;  Location: [place];  Service: Orthopedics;  Laterality: Right;    There were no vitals filed for this visit.    Pt. reports a nice trip to [place] with family.  Pt. reports minimal R shoulder pain currently.          There.ex.:  Seated B UBE 2.5 min. F/b (warm-up/ discussed HEP)  Standing R shoulder AROM (all planes)- mirror feedback 10x each.   Nautilus: 50# seated lat. Pull downs/ 20# B shoulder adduction/ 40# tricep extension/ 30# sh. Extension in standing/ 40# scap. R/ 10# sh. IR/ER (difficulty with ER)- 20x.     Manual tx.:  STM to R anterior shoulder/ proximal biceps in sitting.  R shoulder PROM (ER/ flexion/ abduction)- 10x as tolerated.    PT Long Term Goals - 06/21/20 1004      PT LONG TERM GOAL #1   Title Pt. will increase FOTO to 69 to improve pain-free mobility.    Baseline Initial FOTO: 55    Time 8    Period Weeks    Status New    Target Date 08/15/20      PT LONG TERM GOAL #2   Title Pt. independent with HEP to increase seated R shoulder flexion/ abduction to [facility] as compared to L shoulder to improve overhead reaching.    Baseline Supine L/R shoulder AROM: flexion (166/129 deg.), abduction (162/90 deg.)- increase sh. discomfort/ joint stiffness, ER (52/7 deg.), IR (70/ 59 deg.). Seated R shoulder AROM: flexion 122 deg./ abduction 97 deg.    Time 8    Period Weeks    Status New    Target Date 08/15/20      PT LONG TERM GOAL #3   Title Pt. will increase R shoulder strength to grossly 4+/5 MMT to improve lifting/ carrying tasks at home and work.    Baseline TBD when appropriate    Time 8    Period Weeks    Status New    Target Date  08/15/20      PT LONG TERM GOAL #4   Title Pt. able to complete work tasks with no pain or limitations to improve mobility.    Baseline Pt. limited with work tasks    Time 8    Period Weeks    Status New    Target Date 08/15/20                 Plan - 07/15/20 1615    Clinical Impression Statement Pt. returns to MD on 3/4 to discuss POC/ check progress. Pt. continues to progress with R shoulder AROM in standing: flexion 122 deg./ abduction 120 deg.  Grip strength: R 88#/ L 97#.  Pt. works hard during tx. session and progressing well with resisted/ Nautilus ex. No increase pain in R shoulder but discomfort reported in L shoulder.    Stability/Clinical Decision Making Evolving/Moderate complexity    Clinical Decision Making Moderate    Rehab Potential Good    PT Frequency 2x / week    PT Duration 8 weeks    PT  Treatment/Interventions ADLs/Self Care Home Management;Cryotherapy;Moist Heat;Gait training;Stair training;Functional mobility training;Therapeutic activities;Therapeutic exercise;Balance training;Neuromuscular re-education;Manual techniques;Patient/family education;Passive range of motion;Electrical Stimulation;Scar mobilization    PT Next Visit Plan Increase R shoulder AROM/ strengthening.   SEND MD note next tx. session.  ISSUE strengthening ex. (pt. has home gym).    PT [place]           Patient will benefit from skilled therapeutic intervention in order to improve the following deficits and impairments:  Abnormal gait,Decreased coordination,Decreased range of motion,Difficulty walking,Pain,Improper body mechanics,Impaired flexibility,Hypomobility,Decreased balance,Decreased mobility,Decreased strength,Decreased activity tolerance,Postural dysfunction,Impaired UE functional use  Visit Diagnosis: Status post reverse total shoulder replacement, right  Shoulder joint stiffness, right  Muscle weakness (generalized)  Chronic right shoulder pain     Problem List Patient Active Problem List   Diagnosis Date Noted   Localized osteoarthritis of right shoulder 04/21/2013   Pura Spice, PT, DPT # [phone] 07/15/2020, 4:20 PM  [place] [facility] [address] [place], [place], [address] Phone: [phone]   Fax:  [phone]  Name: Zachary Marshall MRN: [id number] Date of Birth: 1956-01-29

## 2020-07-18 ENCOUNTER — Ambulatory Visit: Payer: BC Managed Care – PPO | Admitting: Physical Therapy

## 2020-07-18 ENCOUNTER — Other Ambulatory Visit: Payer: Self-pay

## 2020-07-18 ENCOUNTER — Encounter: Payer: Self-pay | Admitting: Physical Therapy

## 2020-07-18 DIAGNOSIS — M6281 Muscle weakness (generalized): Secondary | ICD-10-CM

## 2020-07-18 DIAGNOSIS — M25611 Stiffness of right shoulder, not elsewhere classified: Secondary | ICD-10-CM

## 2020-07-18 DIAGNOSIS — Z96611 Presence of right artificial shoulder joint: Secondary | ICD-10-CM

## 2020-07-18 DIAGNOSIS — M25511 Pain in right shoulder: Secondary | ICD-10-CM

## 2020-07-18 DIAGNOSIS — G8929 Other chronic pain: Secondary | ICD-10-CM

## 2020-07-18 NOTE — Therapy (Signed)
[facility] [facility] [address] [place], [place], [address] Phone: [phone]   Fax:  [phone]  Physical Therapy Treatment  Patient Details  Name: AZARIUS LAMBSON MRN: [id number] Date of Birth: [birth year] Referring Provider (PT): Dr. Mardelle Matte   Encounter Date: 07/18/2020   PT End of Session - 07/18/20 1733    Visit Number 5    Number of Visits 17    Date for PT Re-Evaluation 08/15/20    Authorization - Visit Number 5    Authorization - Number of Visits 10    PT Start Time 6314    PT Stop Time 1151    PT Time Calculation (min) 40 min    Activity Tolerance Patient tolerated treatment well    Behavior During Therapy [facility] for tasks assessed/performed           Past Medical History:  Diagnosis Date  . Arthritis   . 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 PONV (postoperative nausea and vomiting)     Past Surgical History:  Procedure Laterality Date  . APPENDECTOMY    . HERNIA REPAIR Right   . SHOULDER HEMI-ARTHROPLASTY Left 04/21/2013   Procedure: SHOULDER HEMI-ARTHROPLASTY, left;  Surgeon: Johnny Bridge, MD;  Location: [place];  Service: Orthopedics;  Laterality: Left;  . TOTAL ANKLE ARTHROPLASTY Left 03/01/2020   at [place]  . TOTAL SHOULDER ARTHROPLASTY Right 05/05/2020   Procedure: RIGHT TOTAL SHOULDER ARTHROPLASTY;  Surgeon: Marchia Bond, MD;  Location: [place];  Service: Orthopedics;  Laterality: Right;    There were no vitals filed for this visit.   Subjective Assessment - 07/18/20 1730    Subjective Pt. reports a some discomfort in L lateral ankle, esp. in the morning.  Pt. states he is returning to most UE activities and has completed Nautilus machine at home (light resistance).    Pertinent History Pt. known to PT in past .    Limitations House hold activities;Standing;Walking;Lifting    Patient Stated Goals Increase R shoulder AROM/ stability to improve functional  mobility.    Currently in Pain? No/denies            There.ex.:  Standing B shoulder AROM (all planes)- reassessment of R sh. IR/ER (mirror feedback).  UBE: 2 min. F/b (discussed R shoulder/ L ankle limitations and c/o joint stiffness).  Nautilus: 50# lat.pull downs/ 50# scap. Retraction/ 40# tricep ext./ 30# shoulder adduction (B with handles)/ 20# sh. IR (single handle)/ 10# sh. ER (assist with R shoulder/ 20# serratus punches L/R 30x each.  Good technique with minimal cuing.    See updated HEP  .Seated Shoulder Flexion AAROM with Pulley Behind - 2 x daily - 7 x weekly - 2 sets - 15 reps .Seated Shoulder Abduction AAROM with Pulley Behind - 2 x daily - 7 x weekly - 2 sets - 15 reps .Circular Shoulder Pendulum with Table Support - 1 x daily - 7 x weekly - 1 sets - 10 reps .Standing Shoulder Abduction AAROM with Dowel - 2 x daily - 7 x weekly - 2 sets - 10 reps .Standing Shoulder External Rotation AAROM with Dowel - 2 x daily - 7 x weekly - 2 sets - 10 reps .Scapular Retraction with Resistance - 1 x daily - 3 x weekly - 2 sets - 10 reps .Standing Elbow Extension with Anchored Resistance - 1 x daily - 3 x weekly - 2 sets - 10 reps .Standing Bicep Curls Supinated  with Dumbbells - 1 x daily - 3 x weekly - 2 sets - 10 reps .Standing Lat Pull Down with Resistance - Elbows Bent - 1 x daily - 3 x weekly - 2 sets - 10 reps .Standing Shoulder Internal Rotation with Anchored Resistance - 1 x daily - 3 x weekly - 2 sets - 10 reps .Isometric Shoulder External Rotation at Wall - 1 x daily - 3 x weekly - 1 sets - 10 reps - 10 seconds hold   PT recommended use of ice if any R shoulder soreness     PT Education - 07/18/20 1746    Education Details Added new HEP.  Pt. may use home UE machine with low resistance and <5# dumbbells.    Person(s) Educated Patient    Methods Explanation;Demonstration;Handout    Comprehension Verbalized understanding;Returned demonstration               PT  Long Term Goals - 06/21/20 1004      PT LONG TERM GOAL #1   Title Pt. will increase FOTO to 69 to improve pain-free mobility.    Baseline Initial FOTO: 55    Time 8    Period Weeks    Status New    Target Date 08/15/20      PT LONG TERM GOAL #2   Title Pt. independent with HEP to increase seated R shoulder flexion/ abduction to [facility] as compared to L shoulder to improve overhead reaching.    Baseline Supine L/R shoulder AROM: flexion (166/129 deg.), abduction (162/90 deg.)- increase sh. discomfort/ joint stiffness, ER (52/7 deg.), IR (70/ 59 deg.). Seated R shoulder AROM: flexion 122 deg./ abduction 97 deg.    Time 8    Period Weeks    Status New    Target Date 08/15/20      PT LONG TERM GOAL #3   Title Pt. will increase R shoulder strength to grossly 4+/5 MMT to improve lifting/ carrying tasks at home and work.    Baseline TBD when appropriate    Time 8    Period Weeks    Status New    Target Date 08/15/20      PT LONG TERM GOAL #4   Title Pt. able to complete work tasks with no pain or limitations to improve mobility.    Baseline Pt. limited with work tasks    Time 8    Period Weeks    Status New    Target Date 08/15/20                 Plan - 07/18/20 1734    Clinical Impression Statement Pt. progressing to UE ex. machine to promote more independent based ex.  PT emailed new HEP/ added a few ankle ex.   Pt. presents with functional R shoulder AROM except ER/IR.  Pt. returns to MD for f/u this friday to discuss progress/ POC.  Pt. will continue to benefit from skilled PT services to increase R sh. strengthening/ pain-free mobility.    Stability/Clinical Decision Making Evolving/Moderate complexity    Clinical Decision Making Moderate    Rehab Potential Good    PT Frequency 2x / week    PT Duration 8 weeks    PT Treatment/Interventions ADLs/Self Care Home Management;Cryotherapy;Moist Heat;Gait training;Stair training;Functional mobility training;Therapeutic  activities;Therapeutic exercise;Balance training;Neuromuscular re-education;Manual techniques;Patient/family education;Passive range of motion;Electrical Stimulation;Scar mobilization    PT Next Visit Plan Increase R shoulder AROM/ strengthening.   Discuss MD f/u    PT Home Exercise  Plan [id number]           Patient will benefit from skilled therapeutic intervention in order to improve the following deficits and impairments:  Abnormal gait,Decreased coordination,Decreased range of motion,Difficulty walking,Pain,Improper body mechanics,Impaired flexibility,Hypomobility,Decreased balance,Decreased mobility,Decreased strength,Decreased activity tolerance,Postural dysfunction,Impaired UE functional use  Visit Diagnosis: Status post reverse total shoulder replacement, right  Shoulder joint stiffness, right  Muscle weakness (generalized)  Chronic right shoulder pain     Problem List Patient Active Problem List   Diagnosis Date Noted  . Localized osteoarthritis of right shoulder 04/21/2013   Pura Spice, PT, DPT # [phone] 07/18/2020, 5:48 PM  [place] [facility] [address] [place], [place], [address] Phone: [phone]   Fax:  [phone]  Name: JONCARLO FRIBERG MRN: [id number] Date of Birth: 24-Jan-1956

## 2020-07-18 NOTE — Patient Instructions (Signed)
.  Seated Shoulder Flexion AAROM with Pulley Behind - 2 x daily - 7 x weekly - 2 sets - 15 reps .Seated Shoulder Abduction AAROM with Pulley Behind - 2 x daily - 7 x weekly - 2 sets - 15 reps .Circular Shoulder Pendulum with Table Support - 1 x daily - 7 x weekly - 1 sets - 10 reps .Standing Shoulder Abduction AAROM with Dowel - 2 x daily - 7 x weekly - 2 sets - 10 reps .Standing Shoulder External Rotation AAROM with Dowel - 2 x daily - 7 x weekly - 2 sets - 10 reps .Scapular Retraction with Resistance - 1 x daily - 3 x weekly - 2 sets - 10 reps .Standing Elbow Extension with Anchored Resistance - 1 x daily - 3 x weekly - 2 sets - 10 reps .Standing Bicep Curls Supinated with Dumbbells - 1 x daily - 3 x weekly - 2 sets - 10 reps .Standing Lat Pull Down with Resistance - Elbows Bent - 1 x daily - 3 x weekly - 2 sets - 10 reps .Standing Shoulder Internal Rotation with Anchored Resistance - 1 x daily - 3 x weekly - 2 sets - 10 reps .Isometric Shoulder External Rotation at Wall - 1 x daily - 3 x weekly - 1 sets - 10 reps - 10 seconds hold

## 2020-07-26 ENCOUNTER — Encounter: Payer: Self-pay | Admitting: Physical Therapy

## 2020-07-26 ENCOUNTER — Other Ambulatory Visit: Payer: Self-pay

## 2020-07-26 ENCOUNTER — Ambulatory Visit: Payer: BC Managed Care – PPO | Attending: Orthopedic Surgery | Admitting: Physical Therapy

## 2020-07-26 DIAGNOSIS — M25511 Pain in right shoulder: Secondary | ICD-10-CM | POA: Diagnosis present

## 2020-07-26 DIAGNOSIS — M25611 Stiffness of right shoulder, not elsewhere classified: Secondary | ICD-10-CM | POA: Insufficient documentation

## 2020-07-26 DIAGNOSIS — G8929 Other chronic pain: Secondary | ICD-10-CM | POA: Diagnosis present

## 2020-07-26 DIAGNOSIS — M6281 Muscle weakness (generalized): Secondary | ICD-10-CM | POA: Diagnosis present

## 2020-07-26 DIAGNOSIS — Z96611 Presence of right artificial shoulder joint: Secondary | ICD-10-CM | POA: Insufficient documentation

## 2020-07-26 NOTE — Therapy (Signed)
[place] [facility] [address] [place], [place], [address] Phone: [phone]   Fax:  [phone]  Physical Therapy Treatment  Patient Details  Name: Zachary Marshall MRN: [id number] Date of Birth: Jun 22, 1955 Referring Provider (PT): Dr. Mardelle Matte   Encounter Date: 07/26/2020   PT End of Session - 07/26/20 1209    Visit Number 6    Number of Visits 17    Date for PT Re-Evaluation 08/15/20    Authorization - Visit Number 6    Authorization - Number of Visits 10    PT Start Time 1114    PT Stop Time 1154    PT Time Calculation (min) 40 min    Activity Tolerance Patient tolerated treatment well    Behavior During Therapy [facility] for tasks assessed/performed           Past Medical History:  Diagnosis Date  . Arthritis   . 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 PONV (postoperative nausea and vomiting)     Past Surgical History:  Procedure Laterality Date  . APPENDECTOMY    . HERNIA REPAIR Right   . SHOULDER HEMI-ARTHROPLASTY Left 04/21/2013   Procedure: SHOULDER HEMI-ARTHROPLASTY, left;  Surgeon: Johnny Bridge, MD;  Location: [place];  Service: Orthopedics;  Laterality: Left;  . TOTAL ANKLE ARTHROPLASTY Left 03/01/2020   at [place]  . TOTAL SHOULDER ARTHROPLASTY Right 05/05/2020   Procedure: RIGHT TOTAL SHOULDER ARTHROPLASTY;  Surgeon: Marchia Bond, MD;  Location: [place];  Service: Orthopedics;  Laterality: Right;    There were no vitals filed for this visit.   Subjective Assessment - 07/26/20 1159    Subjective Pt. states Dr. Mardelle Matte is happy with R shoulder progress.  Pt. reports L ankle has been doing better over past week.  Pt. reports good compliance with HEP and is using upper body wt. machine at home.    Pertinent History Pt. known to PT in past .    Limitations House hold activities;Standing;Walking;Lifting    Patient Stated Goals Increase R shoulder AROM/ stability to improve  functional mobility.    Currently in Pain? No/denies            There.ex.:  Discussed HEP  Standing B shoulder AROM (all planes)- reassessment of R sh. IR/ER (mirror feedback).  UBE: 3 min. F/b (good UE muscle endurance/ no UT compensation noted).  Nautilus: 50# lat.pull downs/ 50# scap. Retraction/ 40# tricep ext./ 30# shoulder adduction (B with handles)/ 20# serratus punches L/R 30x each.  Good technique with minimal cuing.    Standing 5# bicep curls 30x/ GTB B shoulder ER (modified)- 20x.       PT Long Term Goals - 07/26/20 1208      PT LONG TERM GOAL #1   Title Pt. will increase FOTO to 69 to improve pain-free mobility.    Baseline Initial FOTO: 55.  3/8: 72    Time 8    Period Weeks    Status Achieved    Target Date 07/26/20      PT LONG TERM GOAL #2   Title Pt. independent with HEP to increase seated R shoulder flexion/ abduction to [facility] as compared to L shoulder to improve overhead reaching.    Baseline Supine L/R shoulder AROM: flexion (166/129 deg.), abduction (162/90 deg.)- increase sh. discomfort/ joint stiffness, ER (52/7 deg.), IR (70/ 59 deg.). Seated R shoulder AROM: flexion 122 deg./ abduction 97 deg.  3/8: see flowsheet  Time 8    Period Weeks    Status Partially Met    Target Date 08/15/20      PT LONG TERM GOAL #3   Title Pt. will increase R shoulder strength to grossly 4+/5 MMT to improve lifting/ carrying tasks at home and work.    Baseline R shoulder strength grossly 4/5 MMT    Time 8    Period Weeks    Status Partially Met    Target Date 08/15/20      PT LONG TERM GOAL #4   Title Pt. able to complete work tasks with no pain or limitations to improve mobility.    Baseline Pt. limited with work tasks    Time 8    Period Weeks    Status Achieved    Target Date 07/26/20                 Plan - 07/26/20 1201    Clinical Impression Statement Pt. continues to demonstrate good R shoulder AROM and progression of stabilization ex.   Pt. will continue with an independent HEP at this time and instructed to contact PT if any questions/ issues with R shoulder strengthening.  Pt. instructed to allow a couple days of rest between strengthening ex. to allow muscle recovery.  Probable discharge at this time with focus on HEP.  See updated goals.    Stability/Clinical Decision Making Evolving/Moderate complexity    Clinical Decision Making Moderate    Rehab Potential Good    PT Frequency 2x / week    PT Duration 8 weeks    PT Treatment/Interventions ADLs/Self Care Home Management;Cryotherapy;Moist Heat;Gait training;Stair training;Functional mobility training;Therapeutic activities;Therapeutic exercise;Balance training;Neuromuscular re-education;Manual techniques;Patient/family education;Passive range of motion;Electrical Stimulation;Scar mobilization    PT Next Visit Plan Probable discharge.  Pt. will continue with an independent HEP at this time and instructed to contact PT if any questions/ issues.    PT [place]           Patient will benefit from skilled therapeutic intervention in order to improve the following deficits and impairments:  Abnormal gait,Decreased coordination,Decreased range of motion,Difficulty walking,Pain,Improper body mechanics,Impaired flexibility,Hypomobility,Decreased balance,Decreased mobility,Decreased strength,Decreased activity tolerance,Postural dysfunction,Impaired UE functional use  Visit Diagnosis: Status post reverse total shoulder replacement, right  Shoulder joint stiffness, right  Muscle weakness (generalized)  Chronic right shoulder pain     Problem List Patient Active Problem List   Diagnosis Date Noted  . Localized osteoarthritis of right shoulder 04/21/2013   Pura Spice, PT, DPT # [phone] 07/26/2020, 12:16 PM  [facility] [address] [place], [place], [address] Phone: [phone]   Fax:   [phone]  Name: Zachary Marshall MRN: [id number] Date of Birth: 11/09/1955

## 2021-04-16 ENCOUNTER — Other Ambulatory Visit: Payer: Self-pay

## 2021-04-16 MED ORDER — NA SULFATE-K SULFATE-MG SULF 17.5-3.13-1.6 GM/177ML PO SOLN: 1.0000 | ORAL | 0 refills | Status: DC

## 2021-05-29 ENCOUNTER — Telehealth: Payer: BC Managed Care – PPO

## 2021-05-29 NOTE — Telephone Encounter (Signed)
Patient called to reschedule till 07/17/2021 Called endo sent new refferal to nicole and sent new communications

## 2021-07-12 ENCOUNTER — Other Ambulatory Visit: Payer: Self-pay | Admitting: Urology

## 2021-07-12 ENCOUNTER — Other Ambulatory Visit: Payer: Self-pay | Admitting: Orthopedic Surgery

## 2021-07-12 DIAGNOSIS — R972 Elevated prostate specific antigen [PSA]: Secondary | ICD-10-CM

## 2021-07-17 ENCOUNTER — Ambulatory Visit
Admission: RE | Admit: 2021-07-17 | Payer: BC Managed Care – PPO | Source: Home / Self Care | Admitting: Gastroenterology

## 2021-07-17 ENCOUNTER — Encounter: Admission: RE | Payer: Self-pay | Source: Home / Self Care

## 2021-07-17 SURGERY — COLONOSCOPY
Anesthesia: General

## 2021-07-24 ENCOUNTER — Other Ambulatory Visit: Payer: Self-pay

## 2021-07-24 ENCOUNTER — Ambulatory Visit
Admission: RE | Admit: 2021-07-24 | Discharge: 2021-07-24 | Disposition: A | Discharge status: Home / Self Care | Payer: BC Managed Care – PPO | Source: Ambulatory Visit | Attending: Urology | Admitting: Urology

## 2021-07-24 DIAGNOSIS — R972 Elevated prostate specific antigen [PSA]: Secondary | ICD-10-CM | POA: Diagnosis not present

## 2021-07-24 MED ORDER — GADOBUTROL 1 MMOL/ML IV SOLN
7.0000 mL | Freq: Once | INTRAVENOUS | Status: AC | PRN
  Administered 2021-07-24: 7 mL via INTRAVENOUS

## 2021-08-09 ENCOUNTER — Encounter
Admission: RE | Admit: 2021-08-09 | Discharge: 2021-08-09 | Disposition: A | Discharge status: Home / Self Care | Payer: BC Managed Care – PPO | Source: Ambulatory Visit | Attending: Urology | Admitting: Urology

## 2021-08-09 ENCOUNTER — Other Ambulatory Visit: Payer: Self-pay

## 2021-08-09 DIAGNOSIS — E785 Hyperlipidemia, unspecified: Secondary | ICD-10-CM

## 2021-08-09 DIAGNOSIS — D649 Anemia, unspecified: Secondary | ICD-10-CM

## 2021-08-09 HISTORY — DX: Benign prostatic hyperplasia without lower urinary tract symptoms: N40.0

## 2021-08-09 HISTORY — DX: Gastro-esophageal reflux disease without esophagitis: K21.9

## 2021-08-09 HISTORY — DX: Elevated prostate specific antigen (PSA): R97.20

## 2021-08-09 HISTORY — DX: Hyperlipidemia, unspecified: E78.5

## 2021-08-09 NOTE — Patient Instructions (Addendum)
Your procedure is scheduled on:08-17-21 Thursday ?Report to the Registration Desk on the 1st floor of the [facility].Then proceed to the 2nd floor Surgery Desk in the [facility] ?To find out your arrival time, please call [phone] between 1PM - 3PM on:08-16-21 Wednesday ? ?REMEMBER: ?Instructions that are not followed completely may result in serious medical risk, up to and including death; or upon the discretion of your surgeon and anesthesiologist your surgery may need to be rescheduled. ? ?Do not eat food after midnight the night before surgery.  ?No gum chewing, lozengers or hard candies. ? ?You may however, drink CLEAR liquids up to 2 hours before you are scheduled to arrive for your surgery. Do not drink anything within 2 hours of your scheduled arrival time. ? ?Clear liquids include: ?- water  ?- apple juice without pulp ?- gatorade (not RED colors) ?- black coffee or tea (Do NOT add milk or creamers to the coffee or tea) ?Do NOT drink anything that is not on this list. ? ?TAKE THESE MEDICATIONS THE MORNING OF SURGERY WITH A SIP OF WATER: ?-simvastatin (ZOCOR) ?-tamsulosin [facility]) ? ?One week prior to surgery: ?Stop Anti-inflammatories (NSAIDS) such as Advil, Aleve, Ibuprofen, Motrin, Naproxen, Naprosyn and Aspirin based products such as Excedrin, Goodys Powder, BC Powder.You may however, take Tylenol/Tramadol if needed for pain up until the day of surgery. ? ?Stop ANY OVER THE COUNTER supplements/vitamins NOW (08-09-21) until after surgery (Vitamin C, Vitamin D3, Multiple Vitamin) ? ?No Alcohol for 24 hours before or after surgery. ? ?No Smoking including e-cigarettes for 24 hours prior to surgery.  ?No chewable tobacco products for at least 6 hours prior to surgery.  ?No nicotine patches on the day of surgery. ? ?Do not use any "recreational" drugs for at least a week prior to your surgery.  ?Please be advised that the combination of cocaine and anesthesia may have negative outcomes, up to and  including death. ?If you test positive for cocaine, your surgery will be cancelled. ? ?On the morning of surgery brush your teeth with toothpaste and water, you may rinse your mouth with mouthwash if you wish. ?Do not swallow any toothpaste or mouthwash. ? ?Do not wear jewelry, make-up, hairpins, clips or nail polish. ? ?Do not wear lotions, powders, or perfumes.  ? ?Do not shave body from the neck down 48 hours prior to surgery just in case you cut yourself which could leave a site for infection.  ?Also, freshly shaved skin may become irritated if using the CHG soap. ? ?Contact lenses, hearing aids and dentures may not be worn into surgery. ? ?Do not bring valuables to the hospital. [facility] is not responsible for any missing/lost belongings or valuables.  ? ?Fleets enema as directed-Do Fleet Enema at home the morning of surgery 1 hour prior to arrival time-You may repeat enema if needed until clear per Dr Yves Dill ? ?Notify your doctor if there is any change in your medical condition (cold, fever, infection). ? ?Wear comfortable clothing (specific to your surgery type) to the hospital. ? ?After surgery, you can help prevent lung complications by doing breathing exercises.  ?Take deep breaths and cough every 1-2 hours. Your doctor may order a device called an Incentive Spirometer to help you take deep breaths. ?When coughing or sneezing, hold a pillow firmly against your incision with both hands. This is called ?splinting.? Doing this helps protect your incision. It also decreases belly discomfort. ? ?If you are being admitted to the hospital overnight,  leave your suitcase in the car. ?After surgery it may be brought to your room. ? ?If you are being discharged the day of surgery, you will not be allowed to drive home. ?You will need a responsible adult (18 years or older) to drive you home and stay with you that night.  ? ?If you are taking public transportation, you will need to have a responsible adult (18 years  or older) with you. ?Please confirm with your physician that it is acceptable to use public transportation.  ? ?Please call the [facility] Dept. at [phone] if you have any questions about these instructions. ? ?Surgery Visitation Policy: ? ?Patients undergoing a surgery or procedure may have two family members or support persons with them as long as the person is not COVID-19 positive or experiencing its symptoms.  ? ?

## 2021-08-14 ENCOUNTER — Other Ambulatory Visit: Payer: Self-pay

## 2021-08-14 ENCOUNTER — Encounter
Admission: RE | Admit: 2021-08-14 | Discharge: 2021-08-14 | Disposition: A | Discharge status: Home / Self Care | Payer: BC Managed Care – PPO | Source: Ambulatory Visit | Attending: Urology | Admitting: Urology

## 2021-08-14 DIAGNOSIS — R972 Elevated prostate specific antigen [PSA]: Secondary | ICD-10-CM | POA: Diagnosis present

## 2021-08-14 DIAGNOSIS — E785 Hyperlipidemia, unspecified: Secondary | ICD-10-CM | POA: Insufficient documentation

## 2021-08-14 DIAGNOSIS — D649 Anemia, unspecified: Secondary | ICD-10-CM | POA: Insufficient documentation

## 2021-08-14 DIAGNOSIS — Z96611 Presence of right artificial shoulder joint: Secondary | ICD-10-CM | POA: Diagnosis not present

## 2021-08-14 DIAGNOSIS — K219 Gastro-esophageal reflux disease without esophagitis: Secondary | ICD-10-CM | POA: Diagnosis not present

## 2021-08-14 DIAGNOSIS — Z01818 Encounter for other preprocedural examination: Secondary | ICD-10-CM | POA: Insufficient documentation

## 2021-08-14 DIAGNOSIS — C61 Malignant neoplasm of prostate: Secondary | ICD-10-CM | POA: Diagnosis not present

## 2021-08-14 LAB — CBC
HCT: 39.8 % (ref 39.0–52.0)
Hemoglobin: 12.9 g/dL — ABNORMAL LOW (ref 13.0–17.0)
MCH: 29.3 pg (ref 26.0–34.0)
MCHC: 32.4 g/dL (ref 30.0–36.0)
MCV: 90.5 fL (ref 80.0–100.0)
Platelets: 276 10*3/uL (ref 150–400)
RBC: 4.4 MIL/uL (ref 4.22–5.81)
RDW: 13 % (ref 11.5–15.5)
WBC: 5.9 10*3/uL (ref 4.0–10.5)
nRBC: 0 % (ref 0.0–0.2)

## 2021-08-14 LAB — BASIC METABOLIC PANEL
Anion gap: 8 (ref 5–15)
BUN: 15 mg/dL (ref 8–23)
CO2: 28 mmol/L (ref 22–32)
Calcium: 8.8 mg/dL — ABNORMAL LOW (ref 8.9–10.3)
Chloride: 103 mmol/L (ref 98–111)
Creatinine, Ser: 0.9 mg/dL (ref 0.61–1.24)
GFR, Estimated: >60 mL/min (ref >60)
Glucose, Bld: 121 mg/dL — ABNORMAL HIGH (ref 70–99)
Potassium: 4.4 mmol/L (ref 3.5–5.1)
Sodium: 139 mmol/L (ref 135–145)

## 2021-08-15 NOTE — H&P (Signed)
NAME: Zachary Marshall, Zachary Marshall. ?MEDICAL RECORD NO: [id number] ?ACCOUNT NO: [id number] ?DATE OF BIRTH: 02-Feb-1956 ?FACILITY: [facility] ?LOCATION: [facility] ?PHYSICIAN: Otelia Limes. Yves Dill, MD ? ?History and Physical  ? ?DATE OF ADMISSION: 08/17/2021 ? ?SAME DAY SURGERY:  08/17/2021 ? ?CHIEF COMPLAINT:  Elevated PSA and abnormal prostate gland MRI scan. ? ?HISTORY OF PRESENT ILLNESS:  The patient is a 66 year old white male who was found to have an elevated PSA of 5.29 ng/mL in October 2022.  He was subsequently evaluated with Exosome study that indicated a score of 20.06, which was above the cutoff for  ?higher risk for high-grade prostate cancer.  He then had a prostate gland MRI scan on 03/06, which indicated that he had a 57 mL prostate with 2 PI-RADS category 4 lesions present.  He also had 2 PI-RADS category 3 lesions present.  He comes in now for  ?UroNav fusion biopsy of the prostate. ? ?PAST MEDICAL HISTORY: ?ALLERGIES:  No drug allergies. ? ?CURRENT MEDICATIONS: Simvastatin and tamsulosin. ? ?PAST SURGICAL HISTORY:  ?1.  Tonsillectomy in 1965. ?2.  Appendectomy in 1966. ?3.  Ankle replacement in 2021. ?4.  Shoulder replacement 2021. ? ?PAST AND CURRENT MEDICAL CONDITIONS: ?1.  Hypercholesterolemia. ?2.  Peyronie's disease. ?3.  Erectile dysfunction. ?4.  BPH with lower urinary tract symptoms. ? ?REVIEW OF SYSTEMS:  The patient denies chest pain, shortness of breath, diabetes, stroke or heart disease. ? ?SOCIAL HISTORY:  He drinks alcohol rarely.  He denies tobacco use. ? ?FAMILY HISTORY:  Negative for prostate cancer.  There is a family history of diabetes. ? ?PHYSICAL EXAMINATION:   ?VITAL SIGNS:  Height was 5 feet 10 inches, weight 186 pounds, BMI 27. ?GENERAL:  Well-nourished white male in no acute distress. ?HEENT:  Sclerae were clear.  Pupils are equally round, reactive to light and accommodation.  Extraocular movements were intact. ?NECK:  No palpable masses or tenderness.  No audible carotid bruits. ?PULMONARY:   Lungs are clear to auscultation. ?CARDIOVASCULAR:  Regular rhythm and rate. ?ABDOMEN:  Soft and nontender abdomen. ?BACK:  No CVA tenderness. ?GENITOURINARY:  Circumcised.  Testes smooth and nontender 16 mL in size each.  He had a right hydrocele present.  Rectal exam 40 gram smooth, nontender prostate. ?NEUROMUSCULAR: Grossly intact. ? ?IMPRESSION: ?1.  Elevated PSA. ?2.  Abnormal prostate gland MRI scan. ? ?PLAN: UroNav fusion biopsy of the prostate. ? ? ?PUS ?D: 08/10/2021 2:14:43 pm T: 08/10/2021 2:28:00 pm  ?JOB: [id number]/ [id number]  ?

## 2021-08-16 MED ORDER — LACTATED RINGERS IV SOLN: INTRAVENOUS | Status: DC

## 2021-08-16 MED ORDER — FLEET ENEMA 7-19 GM/118ML RE ENEM: 1.0000 | ENEMA | Freq: Once | RECTAL | Status: DC

## 2021-08-16 MED ORDER — GENTAMICIN SULFATE 40 MG/ML IJ SOLN
80.0000 mg | Freq: Once | INTRAVENOUS | Status: DC
  Filled 2021-08-16: qty 2

## 2021-08-16 MED ORDER — FAMOTIDINE 20 MG PO TABS: 20.0000 mg | ORAL_TABLET | Freq: Once | ORAL | Status: AC

## 2021-08-16 MED ORDER — GENTAMICIN IN SALINE 1.6-0.9 MG/ML-% IV SOLN
80.0000 mg | INTRAVENOUS | Status: AC
  Administered 2021-08-17: 80 mg via INTRAVENOUS
  Filled 2021-08-16: qty 50

## 2021-08-16 MED ORDER — CEFAZOLIN SODIUM-DEXTROSE 1-4 GM/50ML-% IV SOLN
1.0000 g | Freq: Once | INTRAVENOUS | Status: AC
Start: 2021-08-16 — End: 2021-08-17
  Administered 2021-08-17: 1 g via INTRAVENOUS

## 2021-08-16 MED ORDER — ORAL CARE MOUTH RINSE: 15.0000 mL | Freq: Once | OROMUCOSAL | Status: AC

## 2021-08-16 MED ORDER — CHLORHEXIDINE GLUCONATE 0.12 % MT SOLN: 15.0000 mL | Freq: Once | OROMUCOSAL | Status: AC

## 2021-08-17 ENCOUNTER — Encounter: Payer: Self-pay | Admitting: Urology

## 2021-08-17 ENCOUNTER — Other Ambulatory Visit: Payer: Self-pay

## 2021-08-17 ENCOUNTER — Ambulatory Visit
Admission: RE | Admit: 2021-08-17 | Discharge: 2021-08-17 | Disposition: A | Discharge status: Home / Self Care | Payer: BC Managed Care – PPO | Attending: Urology | Admitting: Urology

## 2021-08-17 ENCOUNTER — Ambulatory Visit: Payer: BC Managed Care – PPO | Admitting: Anesthesiology

## 2021-08-17 ENCOUNTER — Encounter
Admission: RE | Disposition: A | Discharge status: Home / Self Care | Payer: Self-pay | Source: Home / Self Care | Attending: Urology

## 2021-08-17 DIAGNOSIS — Z96611 Presence of right artificial shoulder joint: Secondary | ICD-10-CM | POA: Insufficient documentation

## 2021-08-17 DIAGNOSIS — C61 Malignant neoplasm of prostate: Secondary | ICD-10-CM | POA: Diagnosis not present

## 2021-08-17 DIAGNOSIS — R972 Elevated prostate specific antigen [PSA]: Secondary | ICD-10-CM | POA: Insufficient documentation

## 2021-08-17 DIAGNOSIS — E785 Hyperlipidemia, unspecified: Secondary | ICD-10-CM | POA: Insufficient documentation

## 2021-08-17 DIAGNOSIS — K219 Gastro-esophageal reflux disease without esophagitis: Secondary | ICD-10-CM | POA: Insufficient documentation

## 2021-08-17 HISTORY — PX: PROSTATE BIOPSY: SHX241

## 2021-08-17 SURGERY — BIOPSY, PROSTATE
Anesthesia: General | Site: Prostate

## 2021-08-17 MED ORDER — ONDANSETRON HCL 4 MG/2ML IJ SOLN: 4.0000 mg | Freq: Once | INTRAMUSCULAR | Status: DC | PRN

## 2021-08-17 MED ORDER — LIDOCAINE HCL (CARDIAC) PF 100 MG/5ML IV SOSY
PREFILLED_SYRINGE | INTRAVENOUS | Status: DC | PRN
Start: 2021-08-17 — End: 2021-08-17
  Administered 2021-08-17: 50 mg via INTRAVENOUS

## 2021-08-17 MED ORDER — ONDANSETRON HCL 4 MG/2ML IJ SOLN
INTRAMUSCULAR | Status: DC | PRN
Start: 2021-08-17 — End: 2021-08-17
  Administered 2021-08-17: 4 mg via INTRAVENOUS

## 2021-08-17 MED ORDER — PROPOFOL 500 MG/50ML IV EMUL
INTRAVENOUS | Status: DC | PRN
  Administered 2021-08-17: 175 ug/kg/min via INTRAVENOUS

## 2021-08-17 MED ORDER — FENTANYL CITRATE (PF) 100 MCG/2ML IJ SOLN
INTRAMUSCULAR | Status: AC
  Filled 2021-08-17: qty 2

## 2021-08-17 MED ORDER — ACETAMINOPHEN 325 MG PO TABS
650.0000 mg | ORAL_TABLET | Freq: Once | ORAL | Status: AC
  Administered 2021-08-17: 650 mg via ORAL

## 2021-08-17 MED ORDER — LEVOFLOXACIN 500 MG PO TABS: 500.0000 mg | ORAL_TABLET | Freq: Every day | ORAL | 0 refills | Status: DC

## 2021-08-17 MED ORDER — ACETAMINOPHEN 325 MG PO TABS
ORAL_TABLET | ORAL | Status: AC
  Filled 2021-08-17: qty 2

## 2021-08-17 MED ORDER — CEFAZOLIN SODIUM-DEXTROSE 1-4 GM/50ML-% IV SOLN
INTRAVENOUS | Status: AC
  Filled 2021-08-17: qty 50

## 2021-08-17 MED ORDER — CHLORHEXIDINE GLUCONATE 0.12 % MT SOLN
OROMUCOSAL | Status: AC
  Administered 2021-08-17: 15 mL via OROMUCOSAL
  Filled 2021-08-17: qty 15

## 2021-08-17 MED ORDER — EPHEDRINE SULFATE (PRESSORS) 50 MG/ML IJ SOLN
INTRAMUSCULAR | Status: DC | PRN
  Administered 2021-08-17 (×3): 5 mg via INTRAVENOUS

## 2021-08-17 MED ORDER — MIDAZOLAM HCL 2 MG/2ML IJ SOLN
INTRAMUSCULAR | Status: AC
Start: 2021-08-17 — End: ?
  Filled 2021-08-17: qty 2

## 2021-08-17 MED ORDER — FENTANYL CITRATE (PF) 100 MCG/2ML IJ SOLN
INTRAMUSCULAR | Status: DC | PRN
  Administered 2021-08-17: 50 ug via INTRAVENOUS

## 2021-08-17 MED ORDER — PROPOFOL 10 MG/ML IV BOLUS
INTRAVENOUS | Status: DC | PRN
  Administered 2021-08-17: 70 mg via INTRAVENOUS

## 2021-08-17 MED ORDER — MIDAZOLAM HCL 2 MG/2ML IJ SOLN
INTRAMUSCULAR | Status: DC | PRN
  Administered 2021-08-17 (×2): 1 mg via INTRAVENOUS

## 2021-08-17 MED ORDER — FENTANYL CITRATE (PF) 100 MCG/2ML IJ SOLN: 25.0000 ug | INTRAMUSCULAR | Status: DC | PRN

## 2021-08-17 MED ORDER — FAMOTIDINE 20 MG PO TABS
ORAL_TABLET | ORAL | Status: AC
  Administered 2021-08-17: 20 mg via ORAL
  Filled 2021-08-17: qty 1

## 2021-08-17 MED ORDER — ACETAMINOPHEN 160 MG/5ML PO SOLN
650.0000 mg | Freq: Once | ORAL | Status: DC
  Filled 2021-08-17: qty 20.3

## 2021-08-17 SURGICAL SUPPLY — 16 items
COVER MAYO STAND REUSABLE (DRAPES) ×2 IMPLANT
COVER TRANSDUCER UNLTRASOUND (MISCELLANEOUS) ×1 IMPLANT
CUP MEDICINE 2OZ PLAST GRAD ST (MISCELLANEOUS) ×2 IMPLANT
GLOVE SURG ENC TEXT LTX SZ7.5 (GLOVE) ×2 IMPLANT
GUIDE NDL URONAV ULTRASND S (MISCELLANEOUS) IMPLANT
GUIDE NEEDLE URONAV ULTRASND S (MISCELLANEOUS) ×1 IMPLANT
INST BIOPSY MAXCORE 18GX25 (NEEDLE) ×2 IMPLANT
PROBE URONAV BK 8808E 8818 HLD (MISCELLANEOUS) IMPLANT
STRAP SAFETY 5IN WIDE (MISCELLANEOUS) ×2 IMPLANT
SURGILUBE 2OZ TUBE FLIPTOP (MISCELLANEOUS) ×2 IMPLANT
TOWEL OR 17X26 4PK STRL BLUE (TOWEL DISPOSABLE) ×2 IMPLANT
URONAV BK 8808E 8818 PROBE HLD (MISCELLANEOUS) ×2
URONAV MRI FUSION TWO PATIENTS (MISCELLANEOUS) ×1 IMPLANT
URONAV ULTRASOUND (MISCELLANEOUS) ×1 IMPLANT
URONAV ULTRASOUND NDL GUIDE S (MISCELLANEOUS) ×2
WATER STERILE IRR 500ML POUR (IV SOLUTION) ×2 IMPLANT

## 2021-08-17 NOTE — H&P (Signed)
Date of Initial H&P: 08/10/21 ? ?History reviewed, patient examined, no change in status, stable for surgery. ?

## 2021-08-17 NOTE — Transfer of Care (Signed)
Immediate Anesthesia Transfer of Care Note ? ?Patient: Zachary Marshall ? ?Procedure(s) Performed: PROSTATE BIOPSY URONAV (Prostate) ? ?Patient Location: PACU ? ?Anesthesia Type:General ? ?Level of Consciousness: drowsy ? ?Airway & Oxygen Therapy: Patient Spontanous Breathing ? ?Post-op Assessment: Report given to RN and Post -op Vital signs reviewed and stable ? ?Post vital signs: Reviewed and stable ? ?Last Vitals:  ?Vitals Value Taken Time  ?BP 112/54 08/17/21 0958  ?Temp    ?Pulse 88 08/17/21 1000  ?Resp 18 08/17/21 1000  ?SpO2 97 % 08/17/21 1000  ?Vitals shown include unvalidated device data. ? ?Last Pain:  ?Vitals:  ? 08/17/21 0849  ?TempSrc: Oral  ?PainSc: 4   ?   ? ?  ? ?Complications: No notable events documented. ?

## 2021-08-17 NOTE — Op Note (Signed)
Preoperative diagnosis: 1.  Elevated PSA (R97.2) ?                                          2.  Abnormal prostate MRI scan (D40.0) ? ?Postoperative diagnosis: Same ? ?Procedure: UroNav fusion prostate biopsy (CPT I4253652, 25366) ? ?Surgeon: Otelia Limes. Yves Dill MD ? ?Anesthesia: General ? ?Indications:See the history and physical also. 66 year old ( [place]: 30-Jan-1956) white male who was found to have an elevated PSA of 5.29 ng/mL in October 2022.  He was subsequently evaluated with Exosome study that indicated a score of 20.06, which was above the cutoff for  ?higher risk for high-grade prostate cancer.  He then had a prostate gland MRI scan on 03/06, which indicated that he had a 57 mL prostate with 2 PI-RADS category 4 lesions present.  He also had 2 PI-RADS category 3 lesions present.  He comes in now for UroNav fusion biopsy of the prostate.After informed consent the above procedure(s) were requested ? ?   ?Technique and findings: After adequate general anesthesia obtained the patient was placed into the left lateral decubitus position and DRE was performed.  The rectal vault was clear.  The ultrasound probe was placed and images acquired.  The MRI images were then fused with the ultrasound images.  Region of interest #1 was identified and 3 core biopsies obtained.  Region of interest #2 was identified and 3 core biopsies taken here.  Region of interest #3 was identified and 3 core biopsies taken here.  Region of interest #4 was identified and 3 core biopsies taken here.  At this point standard 12 core systematic biopsies were performed.  The ultrasound probe was then removed.  Blood loss was minimal.  The procedure was then terminated and patient transferred to the recovery room in stable condition.  ? ?  ? ?  ?

## 2021-08-17 NOTE — Anesthesia Preprocedure Evaluation (Signed)
Anesthesia Evaluation  ?Patient identified by MRN, date of birth, ID band ?Patient awake ? ? ? ?Reviewed: ?Allergy & Precautions, H&P , NPO status , Patient's Chart, lab work & pertinent test results, reviewed documented beta blocker date and time  ? ?History of Anesthesia Complications ?(+) PONV and history of anesthetic complications ? ?Airway ?Mallampati: II ? ? ?Neck ROM: full ? ? ? Dental ? ?(+) Poor Dentition ?  ?Pulmonary ?neg pulmonary ROS,  ?  ?Pulmonary exam normal ? ? ? ? ? ? ? Cardiovascular ?Exercise Tolerance: Good ?negative cardio ROS ?Normal cardiovascular exam ?Rhythm:regular Rate:Normal ? ? ?  ?Neuro/Psych ?negative neurological ROS ? negative psych ROS  ? GI/Hepatic ?Neg liver ROS, GERD  Medicated,  ?Endo/Other  ?negative endocrine ROS ? Renal/GU ?negative Renal ROS  ?negative genitourinary ?  ?Musculoskeletal ? ? Abdominal ?  ?Peds ? Hematology ?negative hematology ROS ?(+)   ?Anesthesia Other Findings ?Past Medical History: ?No date: Arthritis ?No date: BPH (benign prostatic hyperplasia) ?No date: Elevated prostate specific antigen (PSA) ?No date: GERD (gastroesophageal reflux disease) ?    Comment:  occ ?No date: Hyperlipidemia ?04/21/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 ?    Comment:  shoulder replacement,  ?No date: PONV (postoperative nausea and vomiting) ?    Comment:  x 1-after colonoscopy nauseated ?Past Surgical History: ?No date: APPENDECTOMY ?No date: COLONOSCOPY ?No date: HERNIA REPAIR; Right ?04/21/2013: SHOULDER HEMI-ARTHROPLASTY; Left ?    Comment:  Procedure: SHOULDER HEMI-ARTHROPLASTY, left;  Surgeon:  ?             Johnny Bridge, MD;  Location: [facility];  Service:  ?             Orthopedics;  Laterality: Left; ?No date: TONSILLECTOMY ?    Comment:  as a child ?03/01/2020: TOTAL ANKLE ARTHROPLASTY; Left ?    Comment:  at [place] ?05/05/2020: TOTAL SHOULDER ARTHROPLASTY; Right ?    Comment:  Procedure: RIGHT TOTAL SHOULDER ARTHROPLASTY;  Surgeon:  ?              Marchia Bond, MD;  Location: [facility]; ?             Service: Orthopedics;  Laterality: Right; ?BMI   ? Body Mass Index: 25.10 kg/m?  ?  ? Reproductive/Obstetrics ?negative OB ROS ? ?  ? ? ? ? ? ? ? ? ? ? ? ? ? ?  ?  ? ? ? ? ? ? ? ? ?Anesthesia Physical ?Anesthesia Plan ? ?ASA: 2 ? ?Anesthesia Plan: General  ? ?Post-op Pain Management:   ? ?Induction:  ? ?PONV Risk Score and Plan: 4 or greater ? ?Airway Management Planned:  ? ?Additional Equipment:  ? ?Intra-op Plan:  ? ?Post-operative Plan:  ? ?Informed Consent: I have reviewed the patients History and Physical, chart, labs and discussed the procedure including the risks, benefits and alternatives for the proposed anesthesia with the patient or authorized representative who has indicated his/her understanding and acceptance.  ? ? ? ?Dental Advisory Given ? ?Plan Discussed with: CRNA ? ?Anesthesia Plan Comments:   ? ? ? ? ? ? ?Anesthesia Quick Evaluation ? ?

## 2021-08-17 NOTE — Discharge Instructions (Addendum)
Transrectal Ultrasound-Guided Prostate Biopsy, Care After ?What can I expect after the procedure? ?After the procedure, it is common to have: ?Pain and discomfort near your butt (rectum), especially while sitting. ?Pink-colored pee (urine). This is due to small amounts of blood in your pee. ?A burning feeling while peeing. ?Blood in your poop (stool). ?Bleeding from your butt. ?Blood in your semen. ?Follow these instructions at home: ?Medicines ?Take over-the-counter and prescription medicines only as told by your doctor. ?If you were given a sedative during your procedure, do not drive or use machines until your doctor says that it is safe. A sedative is a medicine that helps you relax. ?If you were prescribed an antibiotic medicine, take it as told by your doctor. Do not stop taking it even if you start to feel better. ?Activity ?A sign showing that a person should not lift anything heavy. ? ?Return to your normal activities when your doctor says that it is safe. ?Ask your doctor when it is okay for you to have sex. ?You may have to avoid lifting. Ask your doctor how much you can safely lift. ?General instructions ?A comparison of three sample cups showing dark yellow, yellow, and pale yellow urine. ? ?Drink enough water to keep your pee pale yellow. ?Watch your pee, poop, and semen for new bleeding or bleeding that gets worse. ?Keep all follow-up visits. ?Contact a doctor if: ?You have any of these: ?Blood clots in your pee or poop. ?Blood in your pee more than 2 weeks after the procedure. ?Blood in your semen more than 2 months after the procedure. ?New or worse bleeding in your pee, poop, or semen. ?Very bad belly pain. ?Your pee smells bad or unusual. ?You have trouble peeing. ?Your lower belly feels firm. ?You have problems getting an erection. ?You feel like you may vomit (are nauseous), or you vomit. ?Get help right away if: ?You have a fever or chills. ?You have bright red pee. ?You have very bad pain that  does not get better with medicine. ?You cannot pee. ?Summary ?After this procedure, it is common to have pain and discomfort near your butt, especially while sitting. ?You may have blood in your pee and poop. ?It is common to have blood in your semen. ?Get help right away if you have a fever or chills. ?This information is not intended to replace advice given to you by your health care provider. Make sure you discuss any questions you have with your health care provider. ?Document Revised: 10/31/2020 Document Reviewed: 10/31/2020 ?Elsevier Patient Education ? 2022 [place]. ?   ? ? ?AMBULATORY SURGERY  ?DISCHARGE INSTRUCTIONS ? ? ?The drugs that you were given will stay in your system until tomorrow so for the next 24 hours you should not: ? ?Drive an automobile ?Make any legal decisions ?Drink any alcoholic beverage ? ? ?You may resume regular meals tomorrow.  Today it is better to start with liquids and gradually work up to solid foods. ? ?You may eat anything you prefer, but it is better to start with liquids, then soup and crackers, and gradually work up to solid foods. ? ? ?Please notify your doctor immediately if you have any unusual bleeding, trouble breathing, redness and pain at the surgery site, drainage, fever, or pain not relieved by medication. ? ? ? ?Additional Instructions: ? ? ? ? ? ? ? ?Please contact your physician with any problems or Same Day Surgery at [phone], Monday through Friday 6 am to 4 pm, or  [facility] number at [phone].  ?

## 2021-08-21 NOTE — Anesthesia Postprocedure Evaluation (Signed)
Anesthesia Post Note ? ?Patient: Zachary Marshall ? ?Procedure(s) Performed: PROSTATE BIOPSY URONAV (Prostate) ? ?Patient location during evaluation: PACU ?Anesthesia Type: General ?Level of consciousness: awake and alert ?Pain management: pain level controlled ?Vital Signs Assessment: post-procedure vital signs reviewed and stable ?Respiratory status: spontaneous breathing, nonlabored ventilation, respiratory function stable and patient connected to nasal cannula oxygen ?Cardiovascular status: blood pressure returned to baseline and stable ?Postop Assessment: no apparent nausea or vomiting ?Anesthetic complications: no ? ? ?No notable events documented. ? ? ?Last Vitals:  ?Vitals:  ? 08/17/21 1015 08/17/21 1040  ?BP: 114/80 130/78  ?Pulse: 78 67  ?Resp: 10 14  ?Temp:  (!) 36.3 ?C  ?SpO2: 97% 100%  ?  ?Last Pain:  ?Vitals:  ? 08/17/21 1040  ?TempSrc: Temporal  ?PainSc: 0-No pain  ? ? ?  ?  ?  ?  ?  ?  ? ?Molli Barrows ? ? ? ? ?

## 2021-08-22 LAB — SURGICAL PATHOLOGY

## 2022-07-06 ENCOUNTER — Ambulatory Visit: Payer: BC Managed Care – PPO | Admitting: Urology

## 2022-07-06 VITALS — BP 143/85 | Ht 72.0 in | Wt 178.0 lb

## 2022-07-06 DIAGNOSIS — N433 Hydrocele, unspecified: Secondary | ICD-10-CM

## 2022-07-06 DIAGNOSIS — C61 Malignant neoplasm of prostate: Secondary | ICD-10-CM

## 2022-07-06 LAB — URINALYSIS, COMPLETE
Bilirubin, UA: NEGATIVE
Glucose, UA: NEGATIVE
Ketones, UA: NEGATIVE
Leukocytes,UA: NEGATIVE
Nitrite, UA: NEGATIVE
Protein,UA: NEGATIVE
Specific Gravity, UA: <1.005 — ABNORMAL LOW (ref 1.005–1.030)
Urobilinogen, Ur: 0.2 mg/dL (ref 0.2–1.0)
pH, UA: 5.5 (ref 5.0–7.5)

## 2022-07-06 LAB — MICROSCOPIC EXAMINATION: Bacteria, UA: NONE SEEN

## 2022-07-06 MED ORDER — DUTASTERIDE 0.5 MG PO CAPS: 0.5000 mg | ORAL_CAPSULE | Freq: Every day | ORAL | 3 refills | Status: DC

## 2022-07-06 NOTE — Progress Notes (Signed)
07/06/2022 1:20 PM   Zachary Marshall 21-Apr-1956 [id number]  Referring provider: Sofie Hartigan, MD [address],  [place] [address]  Chief Complaint  Patient presents with   New Patient (Initial Visit)   Urologic history: 1.  Prostate cancer PSA 5.29 02/2021 MRI 07/24/2021: 57 cc gland; PI-RADS 4 lesions x 2; PI-RADS 3 lesions x 2 MR fusion biopsy 08/17/2021 3 cores each ROI + standard 12 core template biopsies Pathology: LB Gleason 3+3 (18%); LLB Gleason 3+3 (11%); ROI #2 1/3 Gleason 3+3 (3%)  2.  BPH with LUTS Tamsulosin/dutasteride (started dutasteride October 2023)  3.  Right hydrocele Has been treated with periodic aspiration   HPI: 67 y.o. male with the above problem list who presents to establish local urologic care after recent retirement of Dr. Yves Dill.  He had previously been seen at [facility] for a second opinion and made a follow-up appointment after learning of Dr. Letta Kocher retirement.  He states he is scheduled for a follow-up MRI next month and confirmatory biopsy in April 2024 Last PSA January 2024 was 3.56 (uncorrected) He also has a bothersome hydrocele and is interested in pursuing hydrocelectomy   PMH: 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   shoulder replacement,    PONV (postoperative nausea and vomiting)    x 1-after colonoscopy nauseated    Surgical History: Past Surgical History:  Procedure Laterality Date   APPENDECTOMY     COLONOSCOPY     HERNIA REPAIR Right    PROSTATE BIOPSY N/A 08/17/2021   Procedure: PROSTATE BIOPSY Vernelle Emerald;  Surgeon: Royston Cowper, MD;  Location: [facility];  Service: Urology;  Laterality: N/A;   SHOULDER HEMI-ARTHROPLASTY Left 04/21/2013   Procedure: SHOULDER HEMI-ARTHROPLASTY, left;  Surgeon: Johnny Bridge, MD;  Location: [facility];  Service: Orthopedics;  Laterality: Left;   TONSILLECTOMY     as a child   TOTAL ANKLE ARTHROPLASTY Left 03/01/2020   at [place] Right 05/05/2020   Procedure: RIGHT TOTAL SHOULDER ARTHROPLASTY;  Surgeon: Marchia Bond, MD;  Location: [facility];  Service: Orthopedics;  Laterality: Right;    Home Medications:  Allergies as of 07/06/2022   No Known Allergies      Medication List        Accurate as of July 06, 2022  1:20 PM. If you have any questions, ask your nurse or doctor.          acetaminophen 500 MG tablet Commonly known as: TYLENOL Take 1,000 mg by mouth every 6 (six) hours as needed.   levofloxacin 500 MG tablet Commonly known as: Levaquin Take 1 tablet (500 mg total) by mouth daily.   multivitamin with minerals Tabs tablet Take 1 tablet by mouth daily.   Na Sulfate-K Sulfate-Mg Sulf 17.5-3.13-1.6 GM/177ML Soln Commonly known as: Suprep Bowel Prep Kit Take 1 kit by mouth as directed.   naproxen sodium 220 MG tablet Commonly known as: ALEVE Take 440 mg by mouth daily as needed (Arthritis).   simvastatin 20 MG tablet Commonly known as: ZOCOR Take 20 mg by mouth every morning.   tamsulosin 0.4 MG Caps capsule Commonly known as: FLOMAX Take 0.4 mg by mouth daily after breakfast.   traMADol 50 MG tablet Commonly known  as: ULTRAM Take 50 mg by mouth every 6 (six) hours as needed (arthritis).   trolamine salicylate 10 % cream Commonly known as: ASPERCREME Apply 1 application. topically daily as needed (Arthritis).   VITAMIN C PO Take 1 tablet by mouth daily.   VITAMIN D3 PO Take 1 tablet by mouth daily.        Allergies: No Known Allergies  Family History: Family History  Problem Relation Age of Onset   Lung cancer Father     Social History:  reports that he has never smoked. He has never used smokeless tobacco. He reports current alcohol use. He reports that he  does not use drugs.   Physical Exam: BP (!) 143/85   Ht 6' (1.829 m)   Wt 178 lb (80.7 kg)   BMI 24.14 kg/m   Constitutional:  Alert and oriented, No acute distress. HEENT: [place] AT Respiratory: Normal respiratory effort, no increased work of breathing. GU: Moderate right hydrocele with some extension into the inguinal canal.  No definite hernia appreciated.  Prostate 50 g, smooth without nodules Skin: No rashes, bruises or suspicious lesions. Neurologic: Grossly intact, no focal deficits, moving all 4 extremities. Psychiatric: Normal mood and affect.  Laboratory Data:  Urinalysis Dipstick/negative   Assessment & Plan:    1.  T1c low risk prostate cancer He was seen at [facility] regarding possible treatment options and active surveillance was recommended which he thinks he will continue to pursue He will keep his appointment for MRI and confirmatory biopsy  2.  Right hydrocele Bothersome and desires to schedule hydrocelectomy instead of aspiration every few months. The procedure was discussed in detail including potential risks of bleeding/hematoma, infection/abscess either which could require secondary surgery for treatment.  The chances of a recurrent hydrocele are low.  Possibility of chronic scrotal pain was discussed.  All questions were answered and he desires to schedule.     Abbie Sons, [place] [address], [place], [place] [address] [phone]

## 2022-07-06 NOTE — H&P (View-Only) (Signed)
 07/06/2022 1:20 PM   Zachary Marshall 07/01/1955 [id number]  Referring provider: Feldpausch, Dale E, MD [address] [place],  [place] [address]  Chief Complaint  Patient presents with   New Patient (Initial Visit)   Urologic history: 1.  Prostate cancer PSA 5.29 02/2021 MRI 07/24/2021: 57 cc gland; PI-RADS 4 lesions x 2; PI-RADS 3 lesions x 2 MR fusion biopsy 08/17/2021 3 cores each ROI + standard 12 core template biopsies Pathology: LB Gleason 3+3 (18%); LLB Gleason 3+3 (11%); ROI #2 1/3 Gleason 3+3 (3%)  2.  BPH with LUTS Tamsulosin/dutasteride (started dutasteride October 2023)  3.  Right hydrocele Has been treated with periodic aspiration   HPI: 66 y.o. male with the above problem list who presents to establish local urologic care after recent retirement of Dr. Wolff.  He had previously been seen at [facility] for a second opinion and made a follow-up appointment after learning of Dr. Wolff's retirement.  He states he is scheduled for a follow-up MRI next month and confirmatory biopsy in April 2024 Last PSA January 2024 was 3.56 (uncorrected) He also has a bothersome hydrocele and is interested in pursuing hydrocelectomy   PMH: 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   shoulder replacement,    PONV (postoperative nausea and vomiting)    x 1-after colonoscopy nauseated    Surgical History: Past Surgical History:  Procedure Laterality Date   APPENDECTOMY     COLONOSCOPY     HERNIA REPAIR Right    PROSTATE BIOPSY N/A 08/17/2021   Procedure: PROSTATE BIOPSY URONAV;  Surgeon: Wolff, Michael R, MD;  Location: [facility];  Service: Urology;  Laterality: N/A;   SHOULDER HEMI-ARTHROPLASTY Left 04/21/2013   Procedure: SHOULDER HEMI-ARTHROPLASTY, left;  Surgeon: Joshua P  Landau, MD;  [facility];  Service: Orthopedics;  Laterality: Left;   TONSILLECTOMY     as a child   TOTAL ANKLE ARTHROPLASTY Left 03/01/2020   at [facility]   TOTAL SHOULDER ARTHROPLASTY Right 05/05/2020   Procedure: RIGHT TOTAL SHOULDER ARTHROPLASTY;  Surgeon: Landau, Joshua, MD;  Location: [facility];  Service: Orthopedics;  Laterality: Right;    Home Medications:  Allergies as of 07/06/2022   No Known Allergies      Medication List        Accurate as of July 06, 2022  1:20 PM. If you have any questions, ask your nurse or doctor.          acetaminophen 500 MG tablet Commonly known as: TYLENOL Take 1,000 mg by mouth every 6 (six) hours as needed.   levofloxacin 500 MG tablet Commonly known as: Levaquin Take 1 tablet (500 mg total) by mouth daily.   multivitamin with minerals Tabs tablet Take 1 tablet by mouth daily.   Na Sulfate-K Sulfate-Mg Sulf 17.5-3.13-1.6 GM/177ML Soln Commonly known as: Suprep Bowel Prep Kit Take 1 kit by mouth as directed.   naproxen sodium 220 MG tablet Commonly known as: ALEVE Take 440 mg by mouth daily as needed (Arthritis).   simvastatin 20 MG tablet Commonly known as: ZOCOR Take 20 mg by mouth every morning.   tamsulosin 0.4 MG Caps capsule Commonly known as: FLOMAX Take 0.4 mg by mouth daily after breakfast.   traMADol 50 MG tablet Commonly known   as: ULTRAM Take 50 mg by mouth every 6 (six) hours as needed (arthritis).   trolamine salicylate 10 % cream Commonly known as: ASPERCREME Apply 1 application. topically daily as needed (Arthritis).   VITAMIN C PO Take 1 tablet by mouth daily.   VITAMIN D3 PO Take 1 tablet by mouth daily.        Allergies: No Known Allergies  Family History: Family History  Problem Relation Age of Onset   Lung cancer Father     Social History:  reports that he has never smoked. He has never used smokeless tobacco. He reports current alcohol use. He reports that he  does not use drugs.   Physical Exam: BP (!) 143/85   Ht 6' (1.829 m)   Wt 178 lb (80.7 kg)   BMI 24.14 kg/m   Constitutional:  Alert and oriented, No acute distress. HEENT: [place] AT Respiratory: Normal respiratory effort, no increased work of breathing. GU: Moderate right hydrocele with some extension into the inguinal canal.  No definite hernia appreciated.  Prostate 50 g, smooth without nodules Skin: No rashes, bruises or suspicious lesions. Neurologic: Grossly intact, no focal deficits, moving all 4 extremities. Psychiatric: Normal mood and affect.  Laboratory Data:  Urinalysis Dipstick/negative   Assessment & Plan:    1.  T1c low risk prostate cancer He was seen at [facility] regarding possible treatment options and active surveillance was recommended which he thinks he will continue to pursue He will keep his appointment for MRI and confirmatory biopsy  2.  Right hydrocele Bothersome and desires to schedule hydrocelectomy instead of aspiration every few months. The procedure was discussed in detail including potential risks of bleeding/hematoma, infection/abscess either which could require secondary surgery for treatment.  The chances of a recurrent hydrocele are low.  Possibility of chronic scrotal pain was discussed.  All questions were answered and he desires to schedule.     Lam Bjorklund C Valine Drozdowski, MD  [facility] [address], [address] [place], [place] [address] [phone]  

## 2022-07-12 ENCOUNTER — Other Ambulatory Visit: Payer: Self-pay | Admitting: Urology

## 2022-07-12 DIAGNOSIS — N433 Hydrocele, unspecified: Secondary | ICD-10-CM

## 2022-07-12 NOTE — Progress Notes (Signed)
Surgical Physician Order Form [facility] [place]  * Scheduling expectation : Next Available  *Length of Case: 60 minutes  *Clearance needed: no  *Anticoagulation Instructions: N/A  *Aspirin Instructions: N/A  *Post-op visit Date/Instructions:  1-3 day drain removal with PA and 6-week follow-up with me  *Diagnosis: Hydrocele  *Procedure:  Right  Hydrocele excision groin/unilateral scrotal approach [id number])   Additional orders: N/A  -Admit type: OUTpatient  -Anesthesia: Choice  -VTE Prophylaxis Standing Order SCD's       Other:   -Standing Lab Orders Per Anesthesia    Lab other: None  -Standing Test orders EKG/Chest x-ray per Anesthesia       Test other:   - Medications:  Ancef 2gm IV  -Other orders:  N/A

## 2022-07-15 ENCOUNTER — Encounter: Payer: Self-pay | Admitting: Urology

## 2022-07-16 ENCOUNTER — Telehealth: Payer: Self-pay

## 2022-07-16 NOTE — Progress Notes (Signed)
   [facility]- Surgical Posting From  Surgery Date: Date: 07/31/2022  Surgeon: Dr. John Giovanni, MD  Inpt ( No  )   Outpt (Yes)   Obs ( No  )   Diagnosis: N43.3 Right Hydrocele  -CPT: 55040  Surgery: Right Hydrocelectomy   Stop Anticoagulations: N/A  Cardiac/Medical/Pulmonary Clearance needed: no  *Orders entered into EPIC  Date: 07/16/22   *Case booked in [place]  Date: 07/12/2022  *Notified pt of Surgery: Date: 07/12/2022  PRE-OP UA & CX: no  *Placed into Prior Authorization Work Fabio Bering Date: 07/16/22  Assistant/laser/rep:No

## 2022-07-16 NOTE — Telephone Encounter (Signed)
I spoke with Zachary Marshall. We have discussed possible surgery dates and Tuesday March 12th, 2024 was agreed upon by all parties. Patient given information about surgery date, what to expect pre-operatively and post operatively.  We discussed that a Pre-Admission Testing office will be calling to set up the pre-op visit that will take place prior to surgery, and that these appointments are typically done over the phone with a Pre-Admissions RN. Informed patient that our office will communicate any additional care to be provided after surgery. Patients questions or concerns were discussed during our call. Advised to call our office should there be any additional information, questions or concerns that arise. Patient verbalized understanding.

## 2022-07-23 ENCOUNTER — Encounter
Admission: RE | Admit: 2022-07-23 | Discharge: 2022-07-23 | Disposition: A | Discharge status: Home / Self Care | Payer: BC Managed Care – PPO | Source: Ambulatory Visit | Attending: Urology | Admitting: Urology

## 2022-07-23 ENCOUNTER — Other Ambulatory Visit: Payer: Self-pay

## 2022-07-23 VITALS — Ht 71.5 in | Wt 178.0 lb

## 2022-07-23 DIAGNOSIS — E785 Hyperlipidemia, unspecified: Secondary | ICD-10-CM

## 2022-07-23 HISTORY — DX: Malignant neoplasm of prostate: C61

## 2022-07-23 NOTE — Patient Instructions (Addendum)
Your procedure is scheduled on: Tuesday 07/31/22 To find out your arrival time, please call [phone] between 1PM - 3PM on:   Monday 07/30/22 Report to the Registration Desk on the 1st floor of the [facility]. Valet parking is available.  If your arrival time is 6:00 am, do not arrive before that time as the [facility] entrance doors do not open until 6:00 am.  REMEMBER: Instructions that are not followed completely may result in serious medical risk, up to and including death; or upon the discretion of your surgeon and anesthesiologist your surgery may need to be rescheduled.  Do not eat food ordrink any liquids after midnight the night before surgery.  No gum chewing or hard candies.  One week prior to surgery: Stop Anti-inflammatories (NSAIDS) such as Advil, Aleve, Ibuprofen, Motrin, Naproxen, Naprosyn and Aspirin based products such as Excedrin, Goody's Powder, BC Powder. You may however, continue to take Tylenol if needed for pain up until the day of surgery.  Stop ANY OVER THE COUNTER supplements until after surgery.  Continue taking all prescribed medications.   TAKE ONLY THESE MEDICATIONS THE MORNING OF SURGERY WITH A SIP OF WATER:  dutasteride (AVODART) 0.5 MG capsule  simvastatin (ZOCOR) 20 MG tablet  tamsulosin (FLOMAX) 0.4 MG CAPS capsule   No Alcohol for 24 hours before or after surgery.  No Smoking including e-cigarettes for 24 hours before surgery.  No chewable tobacco products for at least 6 hours before surgery.  No nicotine patches on the day of surgery.  Do not use any "recreational" drugs for at least a week (preferably 2 weeks) before your surgery.  Please be advised that the combination of cocaine and anesthesia may have negative outcomes, up to and including death. If you test positive for cocaine, your surgery will be cancelled.  On the morning of surgery brush your teeth with toothpaste and water, you may rinse your mouth with mouthwash if you  wish. Do not swallow any toothpaste or mouthwash.  Use CHG Soap or wipes as directed on instruction sheet.  Do not wear lotions, powders, or perfumes.   Do not shave body hair from the neck down 48 hours before surgery.  Wear comfortable clothing (specific to your surgery type) to the hospital.  Do not wear jewelry, make-up, hairpins, clips or nail polish.  Contact lenses, hearing aids and dentures may not be worn into surgery.  Do not bring valuables to the hospital. [facility] is not responsible for any missing/lost belongings or valuables.   Notify your doctor if there is any change in your medical condition (cold, fever, infection).  If you are being discharged the day of surgery, you will not be allowed to drive home. You will need a responsible individual to drive you home and stay with you for 24 hours after surgery.   If you are taking public transportation, you will need to have a responsible individual with you.  If you are being admitted to the hospital overnight, leave your suitcase in the car. After surgery it may be brought to your room.  In case of increased patient census, it may be necessary for you, the patient, to continue your postoperative care in the Same Day Surgery department.  After surgery, you can help prevent lung complications by doing breathing exercises.  Take deep breaths and cough every 1-2 hours. Your doctor may order a device called an Incentive Spirometer to help you take deep breaths. When coughing or sneezing, hold a pillow firmly  against your incision with both hands. This is called "splinting." Doing this helps protect your incision. It also decreases belly discomfort.  Surgery Visitation Policy:  Patients undergoing a surgery or procedure may have two family members or support persons with them as long as the person is not COVID-19 positive or experiencing its symptoms.   Inpatient Visitation:    Visiting hours are 7 a.m. to 8 p.m. Up  to four visitors are allowed at one time in a patient room. The visitors may rotate out with other people during the day. One designated support person (adult) may remain overnight.  Due to an increase in RSV and influenza rates and associated hospitalizations, children ages 82 and under will not be able to visit patients in [facility]. Masks continue to be strongly recommended.  Please call the [facility]. at [phone] if you have any questions about these instructions.     Preparing for Surgery with CHLORHEXIDINE GLUCONATE (CHG) Soap  Chlorhexidine Gluconate (CHG) Soap  o An antiseptic cleaner that kills germs and bonds with the skin to continue killing germs even after washing  o Used for showering the night before surgery and morning of surgery  Before surgery, you can play an important role by reducing the number of germs on your skin.  CHG (Chlorhexidine gluconate) soap is an antiseptic cleanser which kills germs and bonds with the skin to continue killing germs even after washing.  Please do not use if you have an allergy to CHG or antibacterial soaps. If your skin becomes reddened/irritated stop using the CHG.  1. Shower the NIGHT BEFORE SURGERY and the MORNING OF SURGERY with CHG soap.  2. If you choose to wash your hair, wash your hair first as usual with your normal shampoo.  3. After shampooing, rinse your hair and body thoroughly to remove the shampoo.  4. Use CHG as you would any other liquid soap. You can apply CHG directly to the skin and wash gently with a scrungie or a clean washcloth.  5. Apply the CHG soap to your body only from the neck down. Do not use on open wounds or open sores. Avoid contact with your eyes, ears, mouth, and genitals (private parts). Wash face and genitals (private parts) with your normal soap.  6. Wash thoroughly, paying special attention to the area where your surgery will be performed.  7. Thoroughly  rinse your body with warm water.  8. Do not shower/wash with your normal soap after using and rinsing off the CHG soap.  9. Pat yourself dry with a clean towel.  10. Wear clean pajamas to bed the night before surgery.  12. Place clean sheets on your bed the night of your first shower and do not sleep with pets.  13. Shower again with the CHG soap on the day of surgery prior to arriving at the hospital.  14. Do not apply any deodorants/lotions/powders.  15. Please wear clean clothes to the hospital.

## 2022-07-24 ENCOUNTER — Encounter
Admission: RE | Admit: 2022-07-24 | Discharge: 2022-07-24 | Disposition: A | Discharge status: Home / Self Care | Payer: BC Managed Care – PPO | Source: Ambulatory Visit | Attending: Urology | Admitting: Urology

## 2022-07-24 ENCOUNTER — Encounter: Payer: Self-pay | Admitting: Urgent Care

## 2022-07-24 DIAGNOSIS — Z01818 Encounter for other preprocedural examination: Secondary | ICD-10-CM | POA: Diagnosis present

## 2022-07-24 DIAGNOSIS — E785 Hyperlipidemia, unspecified: Secondary | ICD-10-CM | POA: Insufficient documentation

## 2022-07-24 LAB — CBC
HCT: 40.9 % (ref 39.0–52.0)
Hemoglobin: 13.3 g/dL (ref 13.0–17.0)
MCH: 29.8 pg (ref 26.0–34.0)
MCHC: 32.5 g/dL (ref 30.0–36.0)
MCV: 91.7 fL (ref 80.0–100.0)
Platelets: 260 10*3/uL (ref 150–400)
RBC: 4.46 MIL/uL (ref 4.22–5.81)
RDW: 13.2 % (ref 11.5–15.5)
WBC: 6.3 10*3/uL (ref 4.0–10.5)
nRBC: 0 % (ref 0.0–0.2)

## 2022-07-30 MED ORDER — FAMOTIDINE 20 MG PO TABS: 20.0000 mg | ORAL_TABLET | Freq: Once | ORAL | Status: AC

## 2022-07-30 MED ORDER — ORAL CARE MOUTH RINSE: 15.0000 mL | Freq: Once | OROMUCOSAL | Status: AC

## 2022-07-30 MED ORDER — LACTATED RINGERS IV SOLN: INTRAVENOUS | Status: DC

## 2022-07-30 MED ORDER — CHLORHEXIDINE GLUCONATE 0.12 % MT SOLN: 15.0000 mL | Freq: Once | OROMUCOSAL | Status: AC

## 2022-07-30 MED ORDER — CEFAZOLIN SODIUM-DEXTROSE 2-4 GM/100ML-% IV SOLN
2.0000 g | INTRAVENOUS | Status: AC
  Administered 2022-07-31: 2 g via INTRAVENOUS

## 2022-07-31 ENCOUNTER — Ambulatory Visit
Admission: RE | Admit: 2022-07-31 | Discharge: 2022-07-31 | Disposition: A | Discharge status: Home / Self Care | Payer: BC Managed Care – PPO | Attending: Urology | Admitting: Urology

## 2022-07-31 ENCOUNTER — Other Ambulatory Visit: Payer: Self-pay

## 2022-07-31 ENCOUNTER — Encounter
Admission: RE | Disposition: A | Discharge status: Home / Self Care | Payer: Self-pay | Source: Home / Self Care | Attending: Urology

## 2022-07-31 ENCOUNTER — Ambulatory Visit: Payer: BC Managed Care – PPO | Admitting: Urgent Care

## 2022-07-31 ENCOUNTER — Encounter: Payer: Self-pay | Admitting: Urology

## 2022-07-31 ENCOUNTER — Ambulatory Visit: Payer: BC Managed Care – PPO | Admitting: Certified Registered"

## 2022-07-31 DIAGNOSIS — N433 Hydrocele, unspecified: Secondary | ICD-10-CM

## 2022-07-31 HISTORY — PX: HYDROCELE EXCISION: SHX482

## 2022-07-31 SURGERY — HYDROCELECTOMY
Anesthesia: General | Site: Scrotum | Laterality: Right

## 2022-07-31 MED ORDER — LIDOCAINE HCL (CARDIAC) PF 100 MG/5ML IV SOSY
PREFILLED_SYRINGE | INTRAVENOUS | Status: DC | PRN
  Administered 2022-07-31: 80 mg via INTRAVENOUS

## 2022-07-31 MED ORDER — FAMOTIDINE 20 MG PO TABS
ORAL_TABLET | ORAL | Status: AC
  Administered 2022-07-31: 20 mg via ORAL
  Filled 2022-07-31: qty 1

## 2022-07-31 MED ORDER — HYDROCODONE-ACETAMINOPHEN 5-325 MG PO TABS: 1.0000 | ORAL_TABLET | Freq: Four times a day (QID) | ORAL | 0 refills | Status: DC | PRN

## 2022-07-31 MED ORDER — GLYCOPYRROLATE 0.2 MG/ML IJ SOLN
INTRAMUSCULAR | Status: DC | PRN
  Administered 2022-07-31: .2 mg via INTRAVENOUS

## 2022-07-31 MED ORDER — FENTANYL CITRATE (PF) 100 MCG/2ML IJ SOLN
25.0000 ug | INTRAMUSCULAR | Status: DC | PRN
  Administered 2022-07-31 (×3): 25 ug via INTRAVENOUS

## 2022-07-31 MED ORDER — BACITRACIN 500 UNIT/GM EX OINT
TOPICAL_OINTMENT | CUTANEOUS | Status: DC | PRN
  Administered 2022-07-31: 1 via TOPICAL

## 2022-07-31 MED ORDER — PROPOFOL 10 MG/ML IV BOLUS
INTRAVENOUS | Status: DC | PRN
  Administered 2022-07-31: 150 mg via INTRAVENOUS

## 2022-07-31 MED ORDER — CHLORHEXIDINE GLUCONATE 0.12 % MT SOLN
OROMUCOSAL | Status: AC
  Administered 2022-07-31: 15 mL via OROMUCOSAL
  Filled 2022-07-31: qty 15

## 2022-07-31 MED ORDER — ACETAMINOPHEN 10 MG/ML IV SOLN
INTRAVENOUS | Status: DC | PRN
  Administered 2022-07-31: 1000 mg via INTRAVENOUS

## 2022-07-31 MED ORDER — BACITRACIN ZINC 500 UNIT/GM EX OINT
TOPICAL_OINTMENT | CUTANEOUS | Status: AC
  Filled 2022-07-31: qty 28.35

## 2022-07-31 MED ORDER — FENTANYL CITRATE (PF) 100 MCG/2ML IJ SOLN
INTRAMUSCULAR | Status: DC | PRN
  Administered 2022-07-31 (×4): 25 ug via INTRAVENOUS

## 2022-07-31 MED ORDER — MIDAZOLAM HCL 2 MG/2ML IJ SOLN
INTRAMUSCULAR | Status: AC
  Filled 2022-07-31: qty 2

## 2022-07-31 MED ORDER — ONDANSETRON HCL 4 MG/2ML IJ SOLN: 4.0000 mg | Freq: Once | INTRAMUSCULAR | Status: DC | PRN

## 2022-07-31 MED ORDER — CEFAZOLIN SODIUM-DEXTROSE 2-4 GM/100ML-% IV SOLN
INTRAVENOUS | Status: AC
  Filled 2022-07-31: qty 100

## 2022-07-31 MED ORDER — 0.9 % SODIUM CHLORIDE (POUR BTL) OPTIME
TOPICAL | Status: DC | PRN
  Administered 2022-07-31: 300 mL

## 2022-07-31 MED ORDER — FENTANYL CITRATE (PF) 100 MCG/2ML IJ SOLN
INTRAMUSCULAR | Status: AC
  Administered 2022-07-31: 25 ug via INTRAVENOUS
  Filled 2022-07-31: qty 2

## 2022-07-31 MED ORDER — BUPIVACAINE HCL 0.5 % IJ SOLN
INTRAMUSCULAR | Status: DC | PRN
  Administered 2022-07-31: 5 mL

## 2022-07-31 MED ORDER — EPHEDRINE SULFATE (PRESSORS) 50 MG/ML IJ SOLN
INTRAMUSCULAR | Status: DC | PRN
  Administered 2022-07-31: 5 mg via INTRAVENOUS
  Administered 2022-07-31: 10 mg via INTRAVENOUS
  Administered 2022-07-31: 5 mg via INTRAVENOUS

## 2022-07-31 MED ORDER — DEXAMETHASONE SODIUM PHOSPHATE 10 MG/ML IJ SOLN
INTRAMUSCULAR | Status: DC | PRN
  Administered 2022-07-31: 10 mg via INTRAVENOUS

## 2022-07-31 MED ORDER — ACETAMINOPHEN 10 MG/ML IV SOLN
INTRAVENOUS | Status: AC
  Filled 2022-07-31: qty 100

## 2022-07-31 MED ORDER — FENTANYL CITRATE (PF) 100 MCG/2ML IJ SOLN
INTRAMUSCULAR | Status: AC
  Filled 2022-07-31: qty 2

## 2022-07-31 MED ORDER — ONDANSETRON HCL 4 MG/2ML IJ SOLN
INTRAMUSCULAR | Status: DC | PRN
  Administered 2022-07-31: 4 mg via INTRAVENOUS

## 2022-07-31 MED ORDER — PHENYLEPHRINE HCL (PRESSORS) 10 MG/ML IV SOLN
INTRAVENOUS | Status: DC | PRN
  Administered 2022-07-31: 80 ug via INTRAVENOUS

## 2022-07-31 SURGICAL SUPPLY — 36 items
APL PRP STRL LF DISP 70% ISPRP (MISCELLANEOUS) ×1
BLADE CLIPPER SURG (BLADE) ×1 IMPLANT
BLADE SURG 15 STRL LF DISP TIS (BLADE) ×1 IMPLANT
BLADE SURG 15 STRL SS (BLADE) ×1
CHLORAPREP W/TINT 26 (MISCELLANEOUS) ×1 IMPLANT
DRAIN PENROSE 12X.25 LTX STRL (MISCELLANEOUS) IMPLANT
DRAPE LAPAROTOMY 77X122 PED (DRAPES) ×1 IMPLANT
DRSG GAUZE FLUFF 36X18 (GAUZE/BANDAGES/DRESSINGS) ×1 IMPLANT
DRSG TELFA 3X8 NADH STRL (GAUZE/BANDAGES/DRESSINGS) ×1 IMPLANT
ELECT CAUTERY BLADE 6.4 (BLADE) IMPLANT
ELECT REM PT RETURN 9FT ADLT (ELECTROSURGICAL) ×1
ELECTRODE REM PT RTRN 9FT ADLT (ELECTROSURGICAL) ×1 IMPLANT
GAUZE 4X4 16PLY ~~LOC~~+RFID DBL (SPONGE) ×1 IMPLANT
GAUZE SPONGE 4X4 12PLY STRL (GAUZE/BANDAGES/DRESSINGS) IMPLANT
GLOVE SURG UNDER POLY LF SZ7.5 (GLOVE) ×1 IMPLANT
GOWN STRL REUS W/ TWL LRG LVL3 (GOWN DISPOSABLE) ×1 IMPLANT
GOWN STRL REUS W/TWL LRG LVL3 (GOWN DISPOSABLE) ×1
GOWN STRL REUS W/TWL XL LVL4 (GOWN DISPOSABLE) ×1 IMPLANT
KIT TURNOVER KIT A (KITS) ×1 IMPLANT
LABEL OR SOLS (LABEL) ×1 IMPLANT
MANIFOLD NEPTUNE II (INSTRUMENTS) ×1 IMPLANT
NDL HYPO 25X1 1.5 SAFETY (NEEDLE) ×1 IMPLANT
NEEDLE HYPO 25X1 1.5 SAFETY (NEEDLE) ×1 IMPLANT
NS IRRIG 500ML POUR BTL (IV SOLUTION) ×1 IMPLANT
PACK BASIN MINOR ARMC (MISCELLANEOUS) ×1 IMPLANT
SUPPORETR ATHLETIC LG (MISCELLANEOUS) ×1 IMPLANT
SUPPORTER ATHLETIC LG (MISCELLANEOUS) ×1
SUT CHROMIC 3 0 PS 2 (SUTURE) IMPLANT
SUT CHROMIC 3 0 SH 27 (SUTURE) ×1 IMPLANT
SUT ETHILON 3-0 FS-10 30 BLK (SUTURE) ×1
SUT MNCRL 3-0 UNDYED SH (SUTURE) ×1 IMPLANT
SUT MONOCRYL 3-0 UNDYED (SUTURE) ×1
SUTURE EHLN 3-0 FS-10 30 BLK (SUTURE) IMPLANT
SYR 10ML LL (SYRINGE) ×1 IMPLANT
SYR BULB IRRIG 60ML STRL (SYRINGE) ×1 IMPLANT
TRAP FLUID SMOKE EVACUATOR (MISCELLANEOUS) ×1 IMPLANT

## 2022-07-31 NOTE — Interval H&P Note (Signed)
History and Physical Interval Note:  CV:RRR Lungs:clear  07/31/2022 8:55 AM  Zachary Marshall  has presented today for surgery, with the diagnosis of Hydrocele.  The various methods of treatment have been discussed with the patient and family. After consideration of risks, benefits and other options for treatment, the patient has consented to  Procedure(s): HYDROCELECTOMY ADULT (Right) as a surgical intervention.  The patient's history has been reviewed, patient examined, no change in status, stable for surgery.  I have reviewed the patient's chart and labs.  Questions were answered to the patient's satisfaction.     [place]

## 2022-07-31 NOTE — Transfer of Care (Signed)
Immediate Anesthesia Transfer of Care Note  Patient: Zachary Marshall  Procedure(s) Performed: HYDROCELECTOMY ADULT (Right: Scrotum)  Patient Location: PACU  Anesthesia Type:General  Level of Consciousness: drowsy  Airway & Oxygen Therapy: Patient Spontanous Breathing and Patient connected to face mask oxygen  Post-op Assessment: Report given to RN  Post vital signs: stable  Last Vitals:  Vitals Value Taken Time  BP    Temp    Pulse 81 07/31/22 1003  Resp 16 07/31/22 1003  SpO2 100 % 07/31/22 1003  Vitals shown include unvalidated device data.  Last Pain:  Vitals:   07/31/22 0803  TempSrc: Tympanic  PainSc: 0-No pain         Complications: No notable events documented.

## 2022-07-31 NOTE — Anesthesia Procedure Notes (Signed)
Procedure Name: LMA Insertion Date/Time: 07/31/2022 8:59 AM  Performed by: Lerry Liner, CRNAPre-anesthesia Checklist: Patient identified, Emergency Drugs available, Suction available and Patient being monitored Patient Re-evaluated:Patient Re-evaluated prior to induction Oxygen Delivery Method: Circle system utilized Preoxygenation: Pre-oxygenation with 100% oxygen Induction Type: IV induction Ventilation: Mask ventilation without difficulty LMA: LMA inserted LMA Size: 4.0 Number of attempts: 1 Placement Confirmation: positive ETCO2 and breath sounds checked- equal and bilateral Tube secured with: Tape Dental Injury: Teeth and Oropharynx as per pre-operative assessment

## 2022-07-31 NOTE — Op Note (Signed)
   Preoperative diagnosis:  Right hydrocele  Postoperative diagnosis:  Right spermatic cord hydrocele  Procedure: Right hydrocelectomy  Surgeon: Abbie Sons, MD  Anesthesia: General  Complications: None  Intraoperative findings: Large right spermatic cord hydrocele.  Testis normal in appearance.  260 cc clear fluid obtained  EBL: Minimal  Specimens: None  Indication: Zachary Marshall is a 67 y.o. patient with a symptomatic right hydrocele previously managed with periodic aspiration by Dr. Yves Dill.  After reviewing the management options for treatment, he elected to proceed with the above surgical procedure(s). We have discussed the potential benefits and risks of the procedure, side effects of the proposed treatment, the likelihood of the patient achieving the goals of the procedure, and any potential problems that might occur during the procedure or recuperation. Informed consent has been obtained.  Description of procedure:  The patient was taken to the operating room and general anesthesia was induced.  The patient was placed in the supine position, prepped and draped in the usual sterile fashion, and preoperative antibiotics were administered. A preoperative time-out was performed.   Examination under anesthesia was performed and the right testis was palpable with a large mobile mass just superior testis and extending in lower spermatic cord.  Using downward pressure on the fluid-filled mass a transverse incision was made in the midportion of the right hemiscrotum.  Dartos fascia was incised with cautery to expose the hydrocele sac.  The hydrocele sac was then bluntly separated from scrotal walls with a combination of blunt dissection and cautery and delivered into the operative field.  The sac was then incised anteriorly and 260 mL of clear fluid was suctioned.  The hydrocele sac was then opened anteriorly with cautery.  The testis and cord structures were normal in appearance.   There was no evidence of a communicating hydrocele.  The tunica vaginalis of the right testis was intact and was opened with sharp dissection and cautery.  The hydrocele sac was excised with cautery leaving a rim of approximately 2 cm around the testis and cord structures. The hydrocele sac was excised with cautery with a rim that everted and the edges sutured with 3-0 Monocryl suture. A small portion of the tunica vaginalis of the testis was also excised.  1/4 inch Penrose drain was placed through the dependent portion of the right hemiscrotum via a separate stab incision and secured with 3-0 nylon.  The testis was delivered back into the scrotum in its anatomic position.  The incision was anesthetized with 0.5% plain Sensorcaine.  The dartos was closed with a running suture of 3-0 Monocryl and the skin was closed with a running horizontal mattress suture of 3-0 Monocryl.  A dressing of bacitracin ointment, Telfa, fluffs and scrotal support was applied.  The patient was transported to the PACU in stable condition after anesthetic reversal.  Plan: Scheduled for Penrose drain removal 08/02/2022 Postop follow-up scheduled 09/12/22   Abbie Sons, M.D.

## 2022-07-31 NOTE — Anesthesia Preprocedure Evaluation (Signed)
Anesthesia Evaluation  Patient identified by MRN, date of birth, ID band Patient awake    Reviewed: Allergy & Precautions, H&P , NPO status , Patient's Chart, lab work & pertinent test results, reviewed documented beta blocker date and time   History of Anesthesia Complications (+) PONV and history of anesthetic complications  Airway Mallampati: II   Neck ROM: full    Dental  (+) Poor Dentition, Dental Advidsory Given, Caps   Pulmonary neg pulmonary ROS   Pulmonary exam normal        Cardiovascular Exercise Tolerance: Good negative cardio ROS Normal cardiovascular exam Rhythm:regular Rate:Normal     Neuro/Psych negative neurological ROS  negative psych ROS   GI/Hepatic Neg liver ROS,GERD  Medicated,,  Endo/Other  negative endocrine ROS    Renal/GU negative Renal ROS  negative genitourinary   Musculoskeletal   Abdominal   Peds  Hematology negative hematology ROS (+)   Anesthesia Other Findings Past Medical History: No date: Arthritis No date: BPH (benign prostatic hyperplasia) No date: Elevated prostate specific antigen (PSA) No date: GERD (gastroesophageal reflux disease)     Comment:  occ No date: Hyperlipidemia 04/21/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     Comment:  shoulder replacement,  No date: PONV (postoperative nausea and vomiting)     Comment:  x 1-after colonoscopy nauseated Past Surgical History: No date: APPENDECTOMY No date: COLONOSCOPY No date: HERNIA REPAIR; Right 04/21/2013: SHOULDER HEMI-ARTHROPLASTY; Left     Comment:  Procedure: SHOULDER HEMI-ARTHROPLASTY, left;  Surgeon:               Johnny Bridge, MD;  Location: [facility];  Service:               Orthopedics;  Laterality: Left; No date: TONSILLECTOMY     Comment:  as a child 03/01/2020: TOTAL ANKLE ARTHROPLASTY; Left     Comment:  [facility] 05/05/2020: TOTAL SHOULDER ARTHROPLASTY; Right     Comment:  Procedure: RIGHT TOTAL  SHOULDER ARTHROPLASTY;  Surgeon:               Marchia Bond, MD;  Location: [facility];              Service: Orthopedics;  Laterality: Right; BMI    Body Mass Index: 25.10 kg/m     Reproductive/Obstetrics negative OB ROS                             Anesthesia Physical Anesthesia Plan  ASA: 2  Anesthesia Plan: General   Post-op Pain Management:    Induction: Intravenous  PONV Risk Score and Plan: 4 or greater and Ondansetron, Dexamethasone, Midazolam and Treatment may vary due to age or medical condition  Airway Management Planned: LMA  Additional Equipment:   Intra-op Plan:   Post-operative Plan: Extubation in OR  Informed Consent: I have reviewed the patients History and Physical, chart, labs and discussed the procedure including the risks, benefits and alternatives for the proposed anesthesia with the patient or authorized representative who has indicated his/her understanding and acceptance.     Dental Advisory Given  Plan Discussed with: CRNA  Anesthesia Plan Comments:         Anesthesia Quick Evaluation

## 2022-07-31 NOTE — Discharge Instructions (Addendum)
AMBULATORY SURGERY  DISCHARGE INSTRUCTIONS   The drugs that you were given will stay in your system until tomorrow so for the next 24 hours you should not:  Drive an automobile Make any legal decisions Drink any alcoholic beverage   You may resume regular meals tomorrow.  Today it is better to start with liquids and gradually work up to solid foods.  You may eat anything you prefer, but it is better to start with liquids, then soup and crackers, and gradually work up to solid foods.   Please notify your doctor immediately if you have any unusual bleeding, trouble breathing, redness and pain at the surgery site, drainage, fever, or pain not relieved by medication.     Your post-operative visit with Dr.                                       is: Date:                        Time:    Please call to schedule your post-operative visit.  Additional Instructions:  Discharge instructions following scrotal surgery  Call your doctor for: Fever is greater than 100.5 Severe nausea or vomiting Increasing pain not controlled by pain medication Increasing redness or drainage from incisions  The number for questions or concerns is [phone]  Activity level: No lifting greater than 10 pounds (about equal to gallon of milk) for the next 2 weeks or until cleared to do so at follow-up appointment.  Otherwise activity as tolerated by comfort level.  Diet: May resume your regular diet as tolerated  Driving: No driving while still taking opiate pain medications (weight at least 6-8 hours after last dose).  No driving if you still sore from surgery as it may limit her ability to react quickly if necessary.  Medications: Rx hydrocodone was sent to your pharmacy If taking hydrocodone do not take Tylenol or tramadol  Shower/bath: You may shower after your drain is removed on 08/02/2022.  No tub bath, hot tub or swimming for 7 days.  Wound care: He may cover wounds with sterile gauze as  needed to prevent incisions rubbing on close follow-up in any seepage.  Where tight fitting underpants for at least 2 weeks.  He should apply cold compresses (ice or sac of frozen peas/corn) to your scrotum for at least 48 hours to reduce the swelling.  You should expect that his scrotum will swell up initially and then get smaller over the next 2-4 weeks.  Follow-up appointments: 08/02/2022 for drain removal Postop follow-up with Dr. Bernardo Heater 09/08/2022

## 2022-08-01 ENCOUNTER — Encounter: Payer: Self-pay | Admitting: Urology

## 2022-08-01 NOTE — Anesthesia Postprocedure Evaluation (Signed)
Anesthesia Post Note  Patient: Zachary Marshall  Procedure(s) Performed: HYDROCELECTOMY ADULT (Right: Scrotum)  Patient location during evaluation: PACU Anesthesia Type: General Level of consciousness: awake and alert Pain management: pain level controlled Vital Signs Assessment: post-procedure vital signs reviewed and stable Respiratory status: spontaneous breathing, nonlabored ventilation, respiratory function stable and patient connected to nasal cannula oxygen Cardiovascular status: blood pressure returned to baseline and stable Postop Assessment: no apparent nausea or vomiting Anesthetic complications: no   No notable events documented.   Last Vitals:  Vitals:   07/31/22 1036 07/31/22 1048  BP: 129/85 (!) 150/93  Pulse: 67 61  Resp: 12 16  Temp: 36.7 C   SpO2: 98% 100%    Last Pain:  Vitals:   07/31/22 1030  TempSrc:   PainSc: 3                  Martha Clan

## 2022-08-02 ENCOUNTER — Encounter: Payer: Self-pay | Admitting: Physician Assistant

## 2022-08-02 ENCOUNTER — Ambulatory Visit (INDEPENDENT_AMBULATORY_CARE_PROVIDER_SITE_OTHER): Payer: BC Managed Care – PPO | Admitting: Physician Assistant

## 2022-08-02 VITALS — BP 148/88 | HR 58 | Ht 71.5 in | Wt 182.0 lb

## 2022-08-02 DIAGNOSIS — Z4803 Encounter for change or removal of drains: Secondary | ICD-10-CM

## 2022-08-02 NOTE — Progress Notes (Signed)
Patient presented to clinic today for postop drain removal.  He is POD 2 from right hydrocelectomy with Dr. Bernardo Heater for management of a right spermatic cord hydrocele.  On physical exam, scrotum with minimal edema. Surgical incision is well healing with scant focus of coagulated blood emanating from the wound. It separated from the wound easily with no additional bruising or oozing from the incision, low concern for hematoma or seroma. Penrose drain remains in place, no active drainage noted.  Using sterile scissors and forceps, I snipped the securing stitch from the Penrose drain and removed the drain and stitch in their entirety. Patient tolerated well. I covered the drain site with sterile gauze and provided additional gauze pads and ABD pads for at-home wound care.  We reviewed lifting restrictions of 10lbs for 7 days postop.  Debroah Loop, PA-C  08/02/22 10:29 AM

## 2022-08-07 ENCOUNTER — Other Ambulatory Visit: Payer: Self-pay

## 2022-08-07 ENCOUNTER — Telehealth: Payer: Self-pay

## 2022-08-07 DIAGNOSIS — Z1211 Encounter for screening for malignant neoplasm of colon: Secondary | ICD-10-CM

## 2022-08-07 MED ORDER — NA SULFATE-K SULFATE-MG SULF 17.5-3.13-1.6 GM/177ML PO SOLN: 354.0000 mL | Freq: Once | ORAL | 0 refills | Status: AC

## 2022-08-07 NOTE — Telephone Encounter (Signed)
Gastroenterology Pre-Procedure Review  Request Date: 11/09/2022 Requesting Physician: Dr. Allen Norris  PATIENT REVIEW QUESTIONS: The patient responded to the following health history questions as indicated:    1. Are you having any GI issues? no 2. Do you have a personal history of Polyps? no 3. Do you have a family history of Colon Cancer or Polyps? no 4. Diabetes Mellitus? no 5. Joint replacements in the past 12 months?no 6. Major health problems in the past 3 months?no 7. Any artificial heart valves, MVP, or defibrillator?no    MEDICATIONS & ALLERGIES:    Patient reports the following regarding taking any anticoagulation/antiplatelet therapy:   Plavix, Coumadin, Eliquis, Xarelto, Lovenox, Pradaxa, Brilinta, or Effient? no Aspirin? no  Patient confirms/reports the following medications:  Current Outpatient Medications  Medication Sig Dispense Refill   acetaminophen (TYLENOL) 500 MG tablet Take 1,000 mg by mouth every 6 (six) hours as needed.     Ascorbic Acid (VITAMIN C PO) Take 1 tablet by mouth daily.     Cholecalciferol (VITAMIN D3 PO) Take 1 tablet by mouth daily.     dutasteride (AVODART) 0.5 MG capsule Take 1 capsule (0.5 mg total) by mouth daily. 90 capsule 3   HYDROcodone-acetaminophen (NORCO/VICODIN) 5-325 MG tablet Take 1-2 tablets by mouth every 6 (six) hours as needed for moderate pain. 15 tablet 0   Multiple Vitamin (MULTIVITAMIN WITH MINERALS) TABS tablet Take 1 tablet by mouth daily.     naproxen sodium (ALEVE) 220 MG tablet Take 440 mg by mouth daily as needed (Arthritis).     simvastatin (ZOCOR) 20 MG tablet Take 20 mg by mouth every morning.     tamsulosin (FLOMAX) 0.4 MG CAPS capsule Take 0.4 mg by mouth daily after breakfast.     traMADol (ULTRAM) 50 MG tablet Take 50 mg by mouth every 6 (six) hours as needed (arthritis).     No current facility-administered medications for this visit.    Patient confirms/reports the following allergies:  No Known Allergies  No  orders of the defined types were placed in this encounter.   AUTHORIZATION INFORMATION Primary Insurance: 1D#: Group #:  Secondary Insurance: 1D#: Group #:  SCHEDULE INFORMATION: Date:  Time: Location:

## 2022-09-12 ENCOUNTER — Ambulatory Visit (INDEPENDENT_AMBULATORY_CARE_PROVIDER_SITE_OTHER): Payer: BC Managed Care – PPO | Admitting: Urology

## 2022-09-12 ENCOUNTER — Encounter: Payer: Self-pay | Admitting: Urology

## 2022-09-12 VITALS — BP 130/80 | HR 74 | Ht 72.0 in | Wt 173.0 lb

## 2022-09-12 DIAGNOSIS — C61 Malignant neoplasm of prostate: Secondary | ICD-10-CM

## 2022-09-12 DIAGNOSIS — Z9889 Other specified postprocedural states: Secondary | ICD-10-CM | POA: Diagnosis not present

## 2022-09-12 NOTE — Progress Notes (Signed)
I, Zachary Marshall,acting as a scribe for Riki Altes, MD.,have documented all relevant documentation on the behalf of Riki Altes, MD,as directed by  Riki Altes, MD while in the presence of Riki Altes, MD.   Zachary Marshall,acting as a scribe for Riki Altes, MD.,have documented all relevant documentation on the behalf of Riki Altes, MD,as directed by  Riki Altes, MD while in the presence of Riki Altes, MD.   09/12/2022 12:56 PM   Zachary Marshall 1955-11-06 [id number]  Referring provider: Marina Goodell, MD [address] [place],  [place] [address]  Chief Complaint  Patient presents with   Hydrocele   Urologic history: 1.  Prostate cancer PSA 5.29 02/2021 MRI 07/24/2021: 57 cc gland; PI-RADS 4 lesions x 2; PI-RADS 3 lesions x 2 MR fusion biopsy 08/17/2021 3 cores each ROI + standard 12 core template biopsies Pathology: LB Gleason 3+3 (18%); LLB Gleason 3+3 (11%); ROI #2 1/3 Gleason 3+3 (3%)  2.  BPH with LUTS Tamsulosin/dutasteride (started dutasteride October 2023)  3.  Right hydrocele Has been treated with periodic aspiration   HPI: 67 y.o. male presents for post-op follow up.  Status post excision of a right cord hydrocele 07/31/2022. Drain was removed 08/02/2022. He also underwent a confirmatory prostate biopsy on 08/27/2022 at [facility]: prostate volume is 41 cc, PSA density 0.009 with pathology showing atypical small acinar proliferation right midapical core. All other biopsy core showed benign prostate tissue. Pre-biopsy prostate MRI 08/14/2022 showed no lesion suspicious for high grade prostate cancer. 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   shoulder replacement,    PONV (postoperative nausea and vomiting)    x 1-after colonoscopy nauseated   Prostate cancer     Surgical History: Past Surgical History:  Procedure Laterality Date   APPENDECTOMY     COLONOSCOPY     HERNIA REPAIR Right    HYDROCELE EXCISION Right 07/31/2022   Procedure: HYDROCELECTOMY ADULT;  Surgeon: Riki Altes, MD;  Location: [facility] ORS;  Service: Urology;  Laterality: Right;   PROSTATE BIOPSY N/A 08/17/2021   Procedure: PROSTATE BIOPSY Addison Bailey;  Surgeon: Orson Ape, MD;  Location: [facility] ORS;  Service: Urology;  Laterality: N/A;   SHOULDER HEMI-ARTHROPLASTY Left 04/21/2013   Procedure: SHOULDER HEMI-ARTHROPLASTY, left;  Surgeon: Eulas Post, MD;  Location: [facility] OR;  Service: Orthopedics;  Laterality: Left;   TONSILLECTOMY     as a child   TOTAL ANKLE ARTHROPLASTY Left 03/01/2020   at [facility]   TOTAL SHOULDER ARTHROPLASTY Right 05/05/2020   Procedure: RIGHT TOTAL SHOULDER ARTHROPLASTY;  Surgeon: Teryl Lucy, MD;  Location: [facility];  Service: Orthopedics;  Laterality: Right;    Home Medications:  Allergies as of 09/12/2022   No Known Allergies      Medication List        Accurate as of September 12, 2022 12:56 PM. If you have any questions, ask your nurse or doctor.          STOP taking these medications    HYDROcodone-acetaminophen 5-325 MG tablet Commonly known as: NORCO/VICODIN Stopped by: Lorin Picket  Arnaldo Natal, MD       TAKE these medications    acetaminophen 500 MG tablet Commonly known as: TYLENOL Take 1,000 mg by mouth every 6 (six) hours as needed.   dutasteride 0.5 MG capsule Commonly known as: AVODART Take 1 capsule (0.5 mg total) by mouth daily.   multivitamin with minerals Tabs tablet Take 1 tablet by mouth daily.   naproxen sodium 220 MG tablet Commonly known as: ALEVE Take 440 mg by mouth daily as needed (Arthritis).   simvastatin 20 MG tablet Commonly known as:  ZOCOR Take 20 mg by mouth every morning.   tamsulosin 0.4 MG Caps capsule Commonly known as: FLOMAX Take 0.4 mg by mouth daily after breakfast.   traMADol 50 MG tablet Commonly known as: ULTRAM Take 50 mg by mouth every 6 (six) hours as needed (arthritis).   VITAMIN C PO Take 1 tablet by mouth daily.   VITAMIN D3 PO Take 1 tablet by mouth daily.        Family History: Family History  Problem Relation Age of Onset   Lung cancer Father     Social History:  reports that he has never smoked. He has never used smokeless tobacco. He reports current alcohol use of about 3.0 standard drinks of alcohol per week. He reports that he does not use drugs.   Physical Exam: BP 130/80   Pulse 74   Ht 6' (1.829 m)   Wt 173 lb (78.5 kg)   BMI 23.46 kg/m   Constitutional:  Alert and oriented, No acute distress. HEENT: [place] AT Respiratory: Normal respiratory effort, no increased work of breathing. GU: Testes decended bilaterally without masses or tenderness. Mild post-operative change, right spermatic cord, no mass, tenderness, or hematoma. Psychiatric: Normal mood and affect.  Assessment & Plan:    1.  Status-post hydrocelectomy Doing well.  2.  T1c very low risk prostate cancer Recent MRI at [facility] negative for high grade lesions Confirmatory biopsy negative for prostate cancer with one core atypical small acinar proliferation He would like to continue monitoring in [place] and will schedule a 6 month follow up with PSA/DRE.  I have reviewed the above documentation for accuracy and completeness, and I agree with the above.   Riki Altes, MD  [facility] [address], [address] [place], [place] [address] [phone]

## 2022-09-21 ENCOUNTER — Encounter: Payer: Self-pay | Admitting: Emergency Medicine

## 2022-09-21 DIAGNOSIS — R1030 Lower abdominal pain, unspecified: Secondary | ICD-10-CM | POA: Diagnosis present

## 2022-09-21 DIAGNOSIS — Z8546 Personal history of malignant neoplasm of prostate: Secondary | ICD-10-CM | POA: Diagnosis not present

## 2022-09-21 LAB — CBC
HCT: 39.7 % (ref 39.0–52.0)
Hemoglobin: 13.2 g/dL (ref 13.0–17.0)
MCH: 30.1 pg (ref 26.0–34.0)
MCHC: 33.2 g/dL (ref 30.0–36.0)
MCV: 90.6 fL (ref 80.0–100.0)
Platelets: 280 10*3/uL (ref 150–400)
RBC: 4.38 MIL/uL (ref 4.22–5.81)
RDW: 12.8 % (ref 11.5–15.5)
WBC: 8.7 10*3/uL (ref 4.0–10.5)
nRBC: 0 % (ref 0.0–0.2)

## 2022-09-21 LAB — COMPREHENSIVE METABOLIC PANEL
ALT: 18 U/L (ref 0–44)
AST: 23 U/L (ref 15–41)
Albumin: 4.3 g/dL (ref 3.5–5.0)
Alkaline Phosphatase: 77 U/L (ref 38–126)
Anion gap: 8 (ref 5–15)
BUN: 9 mg/dL (ref 8–23)
CO2: 23 mmol/L (ref 22–32)
Calcium: 8.7 mg/dL — ABNORMAL LOW (ref 8.9–10.3)
Chloride: 101 mmol/L (ref 98–111)
Creatinine, Ser: 0.65 mg/dL (ref 0.61–1.24)
GFR, Estimated: >60 mL/min (ref >60)
Glucose, Bld: 126 mg/dL — ABNORMAL HIGH (ref 70–99)
Potassium: 4 mmol/L (ref 3.5–5.1)
Sodium: 132 mmol/L — ABNORMAL LOW (ref 135–145)
Total Bilirubin: 0.6 mg/dL (ref 0.3–1.2)
Total Protein: 7.4 g/dL (ref 6.5–8.1)

## 2022-09-21 LAB — URINALYSIS, ROUTINE W REFLEX MICROSCOPIC
Bacteria, UA: NONE SEEN
Bilirubin Urine: NEGATIVE
Glucose, UA: NEGATIVE mg/dL
Ketones, ur: NEGATIVE mg/dL
Leukocytes,Ua: NEGATIVE
Nitrite: NEGATIVE
Protein, ur: NEGATIVE mg/dL
Specific Gravity, Urine: 1.006 (ref 1.005–1.030)
Squamous Epithelial / HPF: NONE SEEN /HPF (ref 0–5)
pH: 7 (ref 5.0–8.0)

## 2022-09-21 LAB — LIPASE, BLOOD: Lipase: 38 U/L (ref 11–51)

## 2022-09-21 NOTE — ED Triage Notes (Signed)
Pt presents ambulatory to triage via POV with complaints of lower abdominal pain that started two days ago. Rates the pain 7/10 - no meds taken PTA. Hx of appendectomy. A&Ox4 at this time. Denies  fevers chills, N/V/D, CP or SOB.

## 2022-09-22 ENCOUNTER — Emergency Department: Payer: BC Managed Care – PPO

## 2022-09-22 ENCOUNTER — Emergency Department
Admission: EM | Admit: 2022-09-22 | Discharge: 2022-09-22 | Disposition: A | Discharge status: Home / Self Care | Payer: BC Managed Care – PPO | Attending: Emergency Medicine | Admitting: Emergency Medicine

## 2022-09-22 DIAGNOSIS — R1030 Lower abdominal pain, unspecified: Secondary | ICD-10-CM

## 2022-09-22 MED ORDER — POLYETHYLENE GLYCOL 3350 17 G PO PACK: 17.0000 g | PACK | Freq: Every day | ORAL | 0 refills | Status: DC

## 2022-09-22 MED ORDER — DOXYLAMINE SUCCINATE (SLEEP) 25 MG PO TABS: 25.0000 mg | ORAL_TABLET | Freq: Every evening | ORAL | 0 refills | Status: DC | PRN

## 2022-09-22 MED ORDER — IOHEXOL 300 MG/ML  SOLN
100.0000 mL | Freq: Once | INTRAMUSCULAR | Status: AC | PRN
  Administered 2022-09-22: 100 mL via INTRAVENOUS

## 2022-09-22 NOTE — ED Provider Notes (Signed)
[facility] Provider Note    Event Date/Time   First MD Initiated Contact with Patient 09/22/22 0034     (approximate)   History   Abdominal Pain   HPI  Zachary Marshall is a 67 y.o. male   Past medical history of history of prostate cancer on dutasteride who presents to the emergency department with lower abdominal pain starting a couple days ago.  Has been constipated last bowel movement was 3 days ago but has been passing flatus.  History of appendectomy.  No GI bleeding.  No nausea or vomiting.  No urinary symptoms or testicular pain.  No other acute medical complaints.  Difficulty sleeping due to pain.   External Medical Documents Reviewed: Dr. Heywood Footman urology note from 09/12/2022 documenting his prostate cancer history and medications      Physical Exam   Triage Vital Signs: ED Triage Vitals  Enc Vitals Group     BP 09/21/22 2119 (!) 158/93     Pulse Rate 09/21/22 2119 71     Resp 09/21/22 2119 18     Temp 09/21/22 2119 98 F (36.7 C)     Temp Source 09/21/22 2119 Oral     SpO2 09/21/22 2119 100 %     Weight 09/21/22 2120 170 lb (77.1 kg)     Height 09/21/22 2120 6' (1.829 m)     Head Circumference --      Peak Flow --      Pain Score --      Pain Loc --      Pain Edu? --      Excl. in GC? --     Most recent vital signs: Vitals:   09/22/22 0100 09/22/22 0133  BP: (!) 185/91 (!) 176/101  Pulse: (!) 50 63  Resp:    Temp:    SpO2: 100% 100%    General: Awake, no distress.  CV:  Good peripheral perfusion.  Resp:  Normal effort.  Abd:  No distention.  Other:  Awake alert comfortable with soft abdomen with bilateral mild lower quadrant tenderness to palpation; hypertension but otherwise hemodynamics appropriate reassuring.   ED Results / Procedures / Treatments   Labs (all labs ordered are listed, but only abnormal results are displayed) Labs Reviewed  COMPREHENSIVE METABOLIC PANEL - Abnormal; Notable for the following  components:      Result Value   Sodium 132 (*)    Glucose, Bld 126 (*)    Calcium 8.7 (*)    All other components within normal limits  URINALYSIS, ROUTINE W REFLEX MICROSCOPIC - Abnormal; Notable for the following components:   Color, Urine STRAW (*)    APPearance CLEAR (*)    Hgb urine dipstick SMALL (*)    All other components within normal limits  LIPASE, BLOOD  CBC     I ordered and reviewed the above labs they are notable for normal white blood cell count urinalysis without bacteria   RADIOLOGY I independently reviewed and interpreted and pelvis see no obvious obstructive or inflammatory patterns   PROCEDURES:  Critical Care performed: No  Procedures   MEDICATIONS ORDERED IN ED: Medications  iohexol (OMNIPAQUE) 300 MG/ML solution 100 mL (100 mLs Intravenous Contrast Given 09/22/22 0113)     IMPRESSION / MDM / ASSESSMENT AND PLAN / ED COURSE  I reviewed the triage vital signs and the nursing notes.  Patient's presentation is most consistent with acute presentation with potential threat to life or bodily function.  Differential diagnosis includes, but is not limited to, urinary tract infection, intra-abdominal infection, obstruction, diverticulitis, prostatitis   The patient is on the cardiac monitor to evaluate for evidence of arrhythmia and/or significant heart rate changes.  MDM: Is a patient with lower abdominal pain with mild tenderness to palpation history of appendectomy and obstructive symptoms with constipation no bowel movement for several days but passing flatus.  He has no fever no elevation with white blood cell count, and his CT scan is unremarkable.  Urinalysis shows no signs of infection.  I considered hospitalization for admission or observation but given mild symptoms with negative workup as above, I think discharge now with close PMD follow-up and a trial of bowel regimen for his constipation is appropriate this  time.  He understands to return if any new or worsening symptoms        FINAL CLINICAL IMPRESSION(S) / ED DIAGNOSES   Final diagnoses:  Lower abdominal pain     Rx / DC Orders   ED Discharge Orders          Ordered    polyethylene glycol (MIRALAX) 17 g packet  Daily        09/22/22 0206    doxylamine, Sleep, (UNISOM) 25 MG tablet  At bedtime PRN        09/22/22 0206             Note:  This document was prepared using Dragon voice recognition software and may include unintentional dictation errors.    Pilar Jarvis, MD 09/22/22 [phone]

## 2022-09-22 NOTE — Discharge Instructions (Signed)
Your blood test and CT scan did not show any emergencies today.  If this pain is due to bloating in the setting of constipation I think taking MiraLAX once daily can be helpful.  As for your sleep, take doxylamine once per night to help with sleep and is next several days.  Do not take for over 1 week without further consulting your primary doctor.  Take acetaminophen 650 mg and ibuprofen 400 mg every 6 hours for pain.  Take with food.  Sometimes early in disease processes even our best tests can miss some things.  It is important to listen to your body and call your doctor right away or return to the emergency department should you develop any new, worsening, or unexpected symptoms.

## 2022-10-24 ENCOUNTER — Encounter: Payer: Self-pay | Admitting: Anesthesiology

## 2022-11-29 ENCOUNTER — Ambulatory Visit: Admit: 2022-11-29 | Payer: BC Managed Care – PPO | Admitting: Gastroenterology

## 2022-11-29 SURGERY — COLONOSCOPY WITH PROPOFOL
Anesthesia: General

## 2023-01-29 ENCOUNTER — Ambulatory Visit
Admission: RE | Admit: 2023-01-29 | Discharge: 2023-01-29 | Disposition: A | Discharge status: Home / Self Care | Payer: BC Managed Care – PPO | Source: Ambulatory Visit | Attending: Physician Assistant | Admitting: Physician Assistant

## 2023-01-29 VITALS — BP 131/85 | HR 81 | Temp 98.7°F | Resp 16 | Ht 71.5 in | Wt 175.0 lb

## 2023-01-29 DIAGNOSIS — N3001 Acute cystitis with hematuria: Secondary | ICD-10-CM

## 2023-01-29 LAB — URINALYSIS, W/ REFLEX TO CULTURE (INFECTION SUSPECTED)
Bilirubin Urine: NEGATIVE
Glucose, UA: NEGATIVE mg/dL
Ketones, ur: NEGATIVE mg/dL
Nitrite: NEGATIVE
Protein, ur: NEGATIVE mg/dL
Specific Gravity, Urine: 1.01 (ref 1.005–1.030)
WBC, UA: >50 WBC/hpf (ref 0–5)
pH: 6 (ref 5.0–8.0)

## 2023-01-29 MED ORDER — CIPROFLOXACIN HCL 250 MG PO TABS
250.0000 mg | ORAL_TABLET | Freq: Two times a day (BID) | ORAL | 0 refills | Status: AC
Start: 2023-01-29 — End: 2023-02-03

## 2023-01-29 NOTE — ED Provider Notes (Signed)
[facility]    CSN: [id number] Arrival date & time: 01/29/23  [id number]      History   Chief Complaint Chief Complaint  Patient presents with   Urinary Frequency    Appt    HPI Zachary Marshall is a 67 y.o. male.    Urinary Frequency  Presents to urgent care with 1 day symptoms of burning with urination, chills, fever.  Patient endorses Tmax of 101.0 F.  Denies hematuria. Denies abdominal or flank pain.  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   shoulder replacement,    PONV (postoperative nausea and vomiting)    x 1-after colonoscopy nauseated   Prostate cancer [facility])     Patient Active Problem List   Diagnosis Date Noted   Localized osteoarthritis of right shoulder 04/21/2013    Past Surgical History:  Procedure Laterality Date   APPENDECTOMY     COLONOSCOPY     HERNIA REPAIR Right    HYDROCELE EXCISION Right 07/31/2022   Procedure: HYDROCELECTOMY ADULT;  Surgeon: Riki Altes, MD;  Location: [facility] OR[facility];  Service: Urology;  Laterality: Right;   PROSTATE BIOPSY N/A 08/17/2021   Procedure: PROSTATE BIOPSY Addison Bailey;  Surgeon: Orson Ape, MD;  Location: [facility] OR[facility];  Service: Urology;  Laterality: N/A;   SHOULDER HEMI-ARTHROPLASTY Left 04/21/2013   Procedure: SHOULDER HEMI-ARTHROPLASTY, left;  Surgeon: Eulas Post, MD;  Location: [facility] OR;  Service: Orthopedics;  Laterality: Left;   TONSILLECTOMY     as a child   TOTAL ANKLE ARTHROPLASTY Left 03/01/2020   at [facility]   TOTAL SHOULDER ARTHROPLASTY Right 05/05/2020   Procedure: RIGHT TOTAL SHOULDER ARTHROPLASTY;  Surgeon: Teryl Lucy, MD;  Location: [facility];  Service: Orthopedics;  Laterality: Right;       Home Medications    Prior to Admission medications   Medication Sig  Start Date End Date Taking? Authorizing Provider  acetaminophen (TYLENOL) 500 MG tablet Take 1,000 mg by mouth every 6 (six) hours as needed.   Yes [provider]  Ascorbic Acid (VITAMIN C PO) Take 1 tablet by mouth daily.   Yes [provider]  Cholecalciferol (VITAMIN D3 PO) Take 1 tablet by mouth daily.   Yes [provider]  doxylamine, Sleep, (UNISOM) 25 MG tablet Take 1 tablet (25 mg total) by mouth at bedtime as needed for up to 20 days. 09/22/22 01/29/23 Yes Pilar Jarvis, MD  dutasteride (AVODART) 0.5 MG capsule Take 1 capsule (0.5 mg total) by mouth daily. 07/06/22  Yes Stoioff, Verna Czech, MD  Multiple Vitamin (MULTIVITAMIN WITH MINERALS) TABS tablet Take 1 tablet by mouth daily.   Yes [provider]  naproxen sodium (ALEVE) 220 MG tablet Take 440 mg by mouth daily as needed (Arthritis).   Yes [provider]  polyethylene glycol (MIRALAX) 17 g packet Take 17 g by mouth daily. 09/22/22  Yes Pilar Jarvis, MD  simvastatin (ZOCOR) 20 MG tablet Take 20 mg by mouth every morning.   Yes [provider]  tamsulosin (FLOMAX) 0.4 MG CAPS capsule Take 0.4 mg by mouth daily after breakfast.   Yes [provider]  traMADol (ULTRAM) 50 MG tablet Take 50 mg by mouth every 6 (six) hours as needed (arthritis).   Yes [provider]    Family History Family History  Problem Relation Age of Onset   Lung cancer Father     Social History Social History   Tobacco Use   Smoking status: Never   Smokeless tobacco: Never  Vaping Use   Vaping status: Never Used  Substance Use Topics   Alcohol use: Yes    Alcohol/week: 3.0 standard drinks of alcohol    Types: 3 Cans of beer per week    Comment: occasional beer   Drug use: No     Allergies   Patient has no known allergies.   Review of Systems Review of Systems  Genitourinary:  Positive for frequency.     Physical Exam Triage Vital Signs ED Triage Vitals  Encounter Vitals  Group     BP 01/29/23 0945 131/85     Systolic BP Percentile --      Diastolic BP Percentile --      Pulse Rate 01/29/23 0945 81     Resp 01/29/23 0945 16     Temp 01/29/23 0945 98.7 F (37.1 C)     Temp Source 01/29/23 0945 Oral     SpO2 01/29/23 0945 97 %     Weight 01/29/23 0945 175 lb (79.4 kg)     Height 01/29/23 0945 5' 11.5" (1.816 m)     Head Circumference --      Peak Flow --      Pain Score 01/29/23 0952 0     Pain Loc --      Pain Education --      Exclude from Growth Chart --    No data found.  Updated Vital Signs BP 131/85 (BP Location: Right Arm)   Pulse 81   Temp 98.7 F (37.1 C) (Oral)   Resp 16   Ht 5' 11.5" (1.816 m)   Wt 175 lb (79.4 kg)   SpO2 97%   BMI 24.07 kg/m   Visual Acuity Right Eye Distance:   Left Eye Distance:   Bilateral Distance:    Right Eye Near:   Left Eye Near:    Bilateral Near:     Physical Exam   UC Treatments / Results  Labs (all labs ordered are listed, but only abnormal results are displayed) Labs Reviewed  URINALYSIS, W/ REFLEX TO CULTURE (INFECTION SUSPECTED)    EKG   Radiology No results found.  Procedures Procedures (including critical care time)  Medications Ordered in UC Medications - No data to display  Initial Impression / Assessment and Plan / UC Course  I have reviewed the triage vital signs and the nursing notes.  Pertinent labs & imaging results that were available during my care of the patient were reviewed by me and considered in my medical decision making (see chart for details).   Zachary Marshall is a 67 y.o. male presenting with UTI symptoms. Patient is afebrile with recent antipyretics (tylenol), satting well on room air. Overall is well appearing, well hydrated, without respiratory distress. Pulmonary exam is unremarkable.   Reviewed relevant chart history.   No red flag symptoms on exam.  UA is suggestive of urinary tract infection as are the symptoms which the patient describes.   There are leukocytes present as well as many bacteria.  Given the severity of his symptoms, will prescribe ciprofloxacin and cautioned the patient against physical exercise while taking the medication.  Will send urine for culture to verify susceptibility.  Counseled patient on potential for adverse effects with medications prescribed/recommended today,  ER and return-to-clinic precautions discussed, patient verbalized understanding and agreement with care plan.  Final Clinical Impressions(s) / UC Diagnoses   Final diagnoses:  None   Discharge Instructions   None    ED Prescriptions   None    PDMP not reviewed this encounter.   Charma Igo, [place] 01/29/23 1059

## 2023-01-29 NOTE — Discharge Instructions (Signed)
Follow up here or with your primary care provider if your symptoms are worsening or not improving with treatment.     

## 2023-01-29 NOTE — ED Triage Notes (Signed)
Pt c/o burning sensation when urinating,chills & fever x1 day- Entered by patient Tmax 101. Denies any hematuria.

## 2023-01-31 LAB — URINE CULTURE: Culture: 80000 — AB

## 2023-02-04 NOTE — Progress Notes (Unsigned)
02/05/2023 12:30 PM   Zachary Marshall 16-Oct-1955 [id number]  Referring provider: Marina Goodell, MD [address] [place],  [place] [address]  Urological history: 1. Prostate cancer -PSA (05/2022) 3.56 -PSA 5.29 02/2021 -MRI 07/24/2021: 57 cc gland; PI-RADS 4 lesions x 2; PI-RADS 3 lesions x 2 -MR fusion biopsy 08/17/2021 3 cores each ROI + standard 12 core template biopsies -Pathology: LB Gleason 3+3 (18%); LLB Gleason 3+3 (11%); ROI #2 1/3 Gleason 3+3 (3%) -MRI (07/2022) - no index lesions - prostate volume 38.1- PSAD 0.09   2. BPH w/LU TS -tamsulosin 0.4 mg/dutasteride 0.5 mg daily  3. Right hydrocele -right hydrocelectomy (07/2022)   No chief complaint on file.  HPI: Zachary Marshall is a 67 y.o. male who presents today for testicular pain.  Previous records reviewed.  Was seen in Urgent care on 01/29/2023 for dysuria, chills and fevers.   Urinalysis positive for pyuria, bacteriuria and and WBC clumps.   Urine culture positive for E. coli.  Given 5 days of Cipro 250 mg twice daily.    UA ***  PVR ***  PMH: 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   shoulder replacement,    PONV (postoperative nausea and vomiting)    x 1-after colonoscopy nauseated   Prostate cancer [facility])     Surgical History: Past Surgical History:  Procedure Laterality Date   APPENDECTOMY     COLONOSCOPY     HERNIA REPAIR Right    HYDROCELE EXCISION Right 07/31/2022   Procedure: HYDROCELECTOMY ADULT;  Surgeon: Riki Altes, MD;  Location: [facility] OR[facility];  Service: Urology;  Laterality: Right;   PROSTATE BIOPSY N/A 08/17/2021   Procedure: PROSTATE BIOPSY Addison Bailey;  Surgeon: Orson Ape, MD;  Location: [facility];  Service: Urology;  Laterality: N/A;   SHOULDER HEMI-ARTHROPLASTY Left  04/21/2013   Procedure: SHOULDER HEMI-ARTHROPLASTY, left;  Surgeon: Eulas Post, MD;  Location: [facility] OR;  Service: Orthopedics;  Laterality: Left;   TONSILLECTOMY     as a child   TOTAL ANKLE ARTHROPLASTY Left 03/01/2020   at [facility]   TOTAL SHOULDER ARTHROPLASTY Right 05/05/2020   Procedure: RIGHT TOTAL SHOULDER ARTHROPLASTY;  Surgeon: Teryl Lucy, MD;  Location: [facility];  Service: Orthopedics;  Laterality: Right;    Home Medications:  Allergies as of 02/05/2023   No Known Allergies      Medication List        Accurate as of February 04, 2023 12:30 PM. If you have any questions, ask your nurse or doctor.          acetaminophen 500 MG tablet Commonly known as: TYLENOL Take 1,000 mg by mouth every 6 (six) hours as needed.   doxylamine (Sleep) 25 MG tablet Commonly known as: UNISOM Take 1 tablet (25 mg total) by mouth at bedtime as needed for up to 20 days.   dutasteride 0.5 MG capsule Commonly known as: AVODART Take 1 capsule (0.5 mg total) by mouth daily.   multivitamin with minerals Tabs tablet Take 1 tablet by mouth daily.   naproxen sodium 220 MG tablet Commonly known as: ALEVE Take 440 mg by mouth daily as needed (Arthritis).   polyethylene glycol 17 g packet Commonly known as: MiraLax Take 17 g by mouth daily.  simvastatin 20 MG tablet Commonly known as: ZOCOR Take 20 mg by mouth every morning.   tamsulosin 0.4 MG Caps capsule Commonly known as: FLOMAX Take 0.4 mg by mouth daily after breakfast.   traMADol 50 MG tablet Commonly known as: ULTRAM Take 50 mg by mouth every 6 (six) hours as needed (arthritis).   VITAMIN C PO Take 1 tablet by mouth daily.   VITAMIN D3 PO Take 1 tablet by mouth daily.        Allergies: No Known Allergies  Family History: Family History  Problem Relation Age of Onset   Lung cancer Father     Social History:  reports that he has never smoked. He has never used smokeless tobacco. He  reports current alcohol use of about 3.0 standard drinks of alcohol per week. He reports that he does not use drugs.  ROS: Pertinent ROS in HPI  Physical Exam: There were no vitals taken for this visit.  Constitutional:  Well nourished. Alert and oriented, No acute distress. HEENT: [place] AT, moist mucus membranes.  Trachea midline, no masses. Cardiovascular: No clubbing, cyanosis, or edema. Respiratory: Normal respiratory effort, no increased work of breathing. GI: Abdomen is soft, non tender, non distended, no abdominal masses. Liver and spleen not palpable.  No hernias appreciated.  Stool sample for occult testing is not indicated.   GU: No CVA tenderness.  No bladder fullness or masses.  Patient with circumcised/uncircumcised phallus. ***Foreskin easily retracted***  Urethral meatus is patent.  No penile discharge. No penile lesions or rashes. Scrotum without lesions, cysts, rashes and/or edema.  Testicles are located scrotally bilaterally. No masses are appreciated in the testicles. Left and right epididymis are normal. Rectal: Patient with  normal sphincter tone. Anus and perineum without scarring or rashes. No rectal masses are appreciated. Prostate is approximately *** grams, *** nodules are appreciated. Seminal vesicles are normal. Skin: No rashes, bruises or suspicious lesions. Lymph: No cervical or inguinal adenopathy. Neurologic: Grossly intact, no focal deficits, moving all 4 extremities. Psychiatric: Normal mood and affect.  Laboratory Data: Lab Results  Component Value Date   WBC 8.7 09/21/2022   HGB 13.2 09/21/2022   HCT 39.7 09/21/2022   MCV 90.6 09/21/2022   PLT 280 09/21/2022    Lab Results  Component Value Date   CREATININE 0.65 09/21/2022     Lab Results  Component Value Date   AST 23 09/21/2022   Lab Results  Component Value Date   ALT 18 09/21/2022    Urinalysis I have reviewed the labs.  See HPI.    Pertinent Imaging: ***  Assessment & Plan:   ***  1. UTI -UA *** -Urine sent for repeat culture -It may be due to the fact that he was undertreated for the infection  ***  2. Prostate cancer -discussed the findings of the sclerotic lesion on CT in MAY *** -will need a PSA once infection has resolved   No follow-ups on file.  These notes generated with voice recognition software. I apologize for typographical errors.  Cloretta Ned  [facility] [address]  [address] [place], [place] [address] [phone]

## 2023-02-05 ENCOUNTER — Encounter: Payer: Self-pay | Admitting: Urology

## 2023-02-05 ENCOUNTER — Ambulatory Visit (INDEPENDENT_AMBULATORY_CARE_PROVIDER_SITE_OTHER): Payer: BC Managed Care – PPO | Admitting: Urology

## 2023-02-05 DIAGNOSIS — R300 Dysuria: Secondary | ICD-10-CM | POA: Diagnosis not present

## 2023-02-05 DIAGNOSIS — R509 Fever, unspecified: Secondary | ICD-10-CM

## 2023-02-05 DIAGNOSIS — C61 Malignant neoplasm of prostate: Secondary | ICD-10-CM | POA: Diagnosis not present

## 2023-02-05 DIAGNOSIS — B9620 Unspecified Escherichia coli [E. coli] as the cause of diseases classified elsewhere: Secondary | ICD-10-CM

## 2023-02-05 DIAGNOSIS — N453 Epididymo-orchitis: Secondary | ICD-10-CM

## 2023-02-05 DIAGNOSIS — Z87440 Personal history of urinary (tract) infections: Secondary | ICD-10-CM | POA: Diagnosis not present

## 2023-02-05 LAB — MICROSCOPIC EXAMINATION

## 2023-02-05 LAB — URINALYSIS, COMPLETE
Bilirubin, UA: NEGATIVE
Glucose, UA: NEGATIVE
Ketones, UA: NEGATIVE
Leukocytes,UA: NEGATIVE
Nitrite, UA: NEGATIVE
Protein,UA: NEGATIVE
RBC, UA: NEGATIVE
Specific Gravity, UA: <1.005 — ABNORMAL LOW (ref 1.005–1.030)
Urobilinogen, Ur: 0.2 mg/dL (ref 0.2–1.0)
pH, UA: 6 (ref 5.0–7.5)

## 2023-02-05 LAB — BLADDER SCAN AMB NON-IMAGING: Scan Result: 65

## 2023-02-05 MED ORDER — LEVOFLOXACIN 500 MG PO TABS
500.0000 mg | ORAL_TABLET | Freq: Every day | ORAL | 0 refills | Status: DC
Start: 2023-02-05 — End: 2023-12-03

## 2023-02-08 LAB — CULTURE, URINE COMPREHENSIVE

## 2023-02-22 ENCOUNTER — Ambulatory Visit: Payer: BC Managed Care – PPO | Admitting: Urology

## 2023-04-22 ENCOUNTER — Other Ambulatory Visit: Payer: Self-pay | Admitting: *Deleted

## 2023-04-22 DIAGNOSIS — C61 Malignant neoplasm of prostate: Secondary | ICD-10-CM

## 2023-04-24 ENCOUNTER — Other Ambulatory Visit: Payer: BC Managed Care – PPO

## 2023-05-02 ENCOUNTER — Other Ambulatory Visit: Payer: BC Managed Care – PPO

## 2023-05-02 DIAGNOSIS — C61 Malignant neoplasm of prostate: Secondary | ICD-10-CM

## 2023-05-03 ENCOUNTER — Other Ambulatory Visit: Payer: Self-pay | Admitting: Urology

## 2023-05-03 DIAGNOSIS — C61 Malignant neoplasm of prostate: Secondary | ICD-10-CM

## 2023-05-03 DIAGNOSIS — R972 Elevated prostate specific antigen [PSA]: Secondary | ICD-10-CM

## 2023-05-03 LAB — PSA: Prostate Specific Ag, Serum: 6.4 ng/mL — ABNORMAL HIGH (ref 0.0–4.0)

## 2023-05-10 IMAGING — MR MR PROSTATE WO/W CM
48 of 49 series · 48 of 49 positions shown · IV contrast (7ml Gadavist)
Comparison: None.

CLINICAL DATA: Elevated PSA level.  PSA of 5.29 on 03/06/2021.

EXAM:
MR PROSTATE WITHOUT AND WITH CONTRAST
TECHNIQUE: Multiplanar multisequence MRI images were obtained of the pelvis
centered about the prostate. Pre and post contrast images were
obtained.
CONTRAST:  7mL GADAVIST GADOBUTROL 1 MMOL/ML IV SOLN

[Series 3: ax in&out whole · axial · 6.0mm · 0.74mm/px · 1 of 35 slices shown (1 of 2)]
[im 1/35]
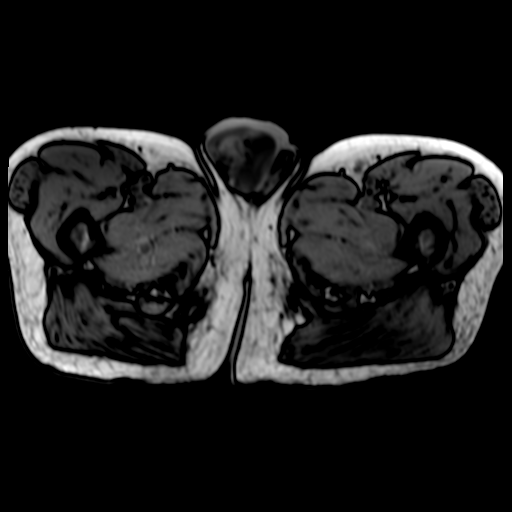

[Series 3: ax in&out whole · axial · 6.0mm · 0.74mm/px · 1 of 35 slices shown (2 of 2)]
[im 1/35]
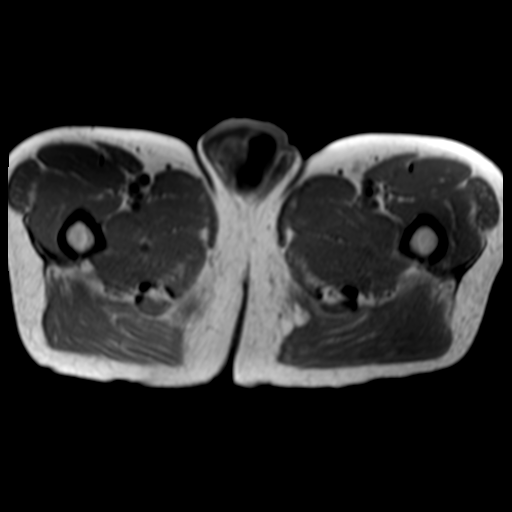

[Series 4: T2 · coronal · 3.0mm · 0.70mm/px · 1 of 35 slices shown (1 of 3)]
[im 1/35]
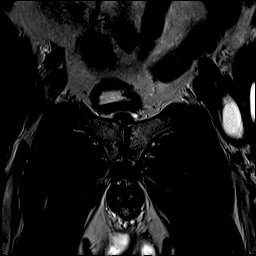

[Series 5: T2 · axial · 3.0mm · 0.56mm/px · 1 of 25 slices shown (2 of 3)]
[im 1/25]
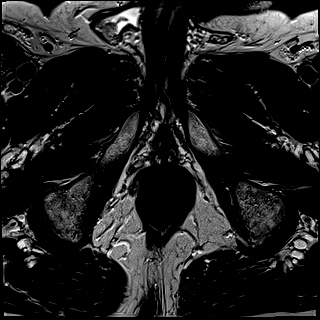

[Series 6: DWI · axial · 3.0mm · 0.86mm/px · 1 of 78 slices shown (1 of 3)]
[im 1/78]
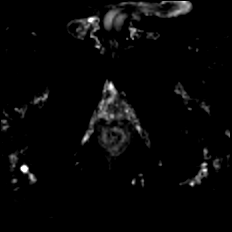

[Series 7: DWI · axial · 3.0mm · 0.86mm/px · 1 of 26 slices shown (2 of 3)]
[im 1/26]
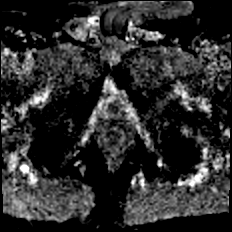

[Series 8: DWI · axial · 3.0mm · 0.86mm/px · 1 of 26 slices shown (3 of 3)]
[im 1/26]
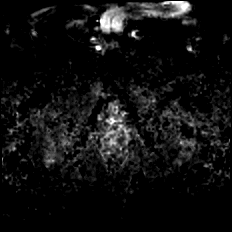

[Series 9: T2 · axial · 1.0mm · 1.04mm/px · 1 of 80 slices shown (3 of 3)]
[im 1/80]
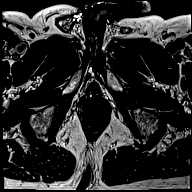

[Series 10: T1 · axial · 3.0mm · 1.15mm/px · 1 of 28 slices shown (1 of 40)]
[im 1/28]
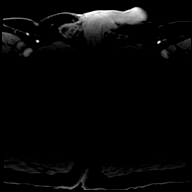

[Series 11: T1 · axial · 3.0mm · 1.15mm/px · 1 of 28 slices shown (2 of 40)]
[im 1/28]
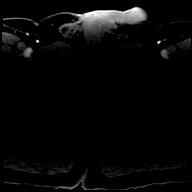

[Series 12: T1 · axial · 3.0mm · 1.15mm/px · 1 of 28 slices shown (3 of 40)]
[im 1/28]
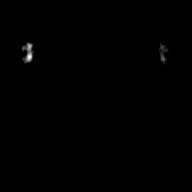

[Series 13: T1 · axial · 3.0mm · 1.15mm/px · 1 of 28 slices shown (4 of 40)]
[im 1/28]
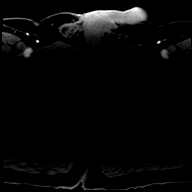

[Series 14: T1 · axial · 3.0mm · 1.15mm/px · 1 of 28 slices shown (5 of 40)]
[im 1/28]
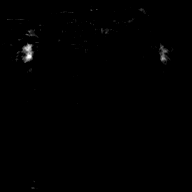

[Series 15: T1 · axial · 3.0mm · 1.15mm/px · 1 of 28 slices shown (6 of 40)]
[im 1/28]
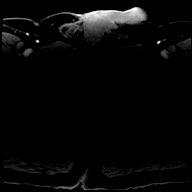

[Series 16: T1 · axial · 3.0mm · 1.15mm/px · 1 of 28 slices shown (7 of 40)]
[im 1/28]
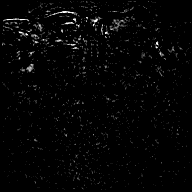

[Series 17: T1 · axial · 3.0mm · 1.15mm/px · 1 of 28 slices shown (8 of 40)]
[im 1/28]
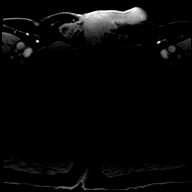

[Series 18: T1 · axial · 3.0mm · 1.15mm/px · 1 of 28 slices shown (9 of 40)]
[im 1/28]
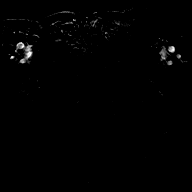

[Series 19: T1 · axial · 3.0mm · 1.15mm/px · 1 of 28 slices shown (10 of 40)]
[im 1/28]
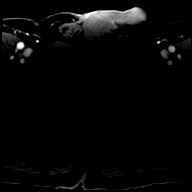

[Series 20: T1 · axial · 3.0mm · 1.15mm/px · 1 of 28 slices shown (11 of 40)]
[im 1/28]
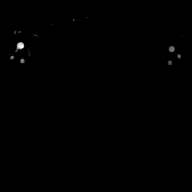

[Series 21: T1 · axial · 3.0mm · 1.15mm/px · 1 of 28 slices shown (12 of 40)]
[im 1/28]
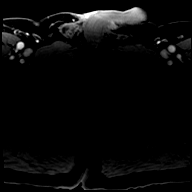

[Series 22: T1 · axial · 3.0mm · 1.15mm/px · 1 of 28 slices shown (13 of 40)]
[im 1/28]
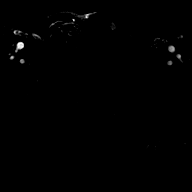

[Series 23: T1 · axial · 3.0mm · 1.15mm/px · 1 of 28 slices shown (14 of 40)]
[im 1/28]
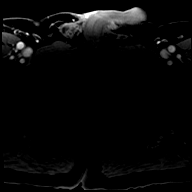

[Series 24: T1 · axial · 3.0mm · 1.15mm/px · 1 of 28 slices shown (15 of 40)]
[im 1/28]
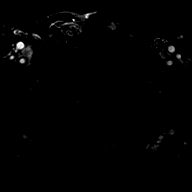

[Series 25: T1 · axial · 3.0mm · 1.15mm/px · 1 of 28 slices shown (16 of 40)]
[im 1/28]
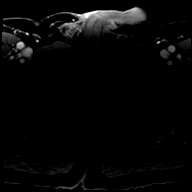

[Series 26: T1 · axial · 3.0mm · 1.15mm/px · 1 of 28 slices shown (17 of 40)]
[im 1/28]
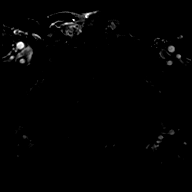

[Series 27: T1 · axial · 3.0mm · 1.15mm/px · 1 of 28 slices shown (18 of 40)]
[im 1/28]
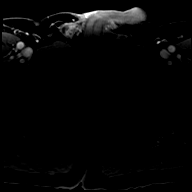

[Series 28: T1 · axial · 3.0mm · 1.15mm/px · 1 of 28 slices shown (19 of 40)]
[im 1/28]
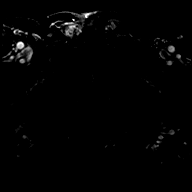

[Series 29: T1 · axial · 3.0mm · 1.15mm/px · 1 of 28 slices shown (20 of 40)]
[im 1/28]
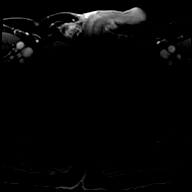

[Series 30: T1 · axial · 3.0mm · 1.15mm/px · 1 of 28 slices shown (21 of 40)]
[im 1/28]
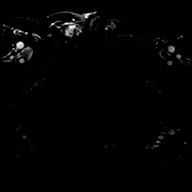

[Series 31: T1 · axial · 3.0mm · 1.15mm/px · 1 of 28 slices shown (22 of 40)]
[im 1/28]
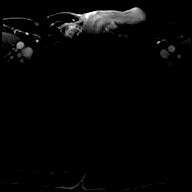

[Series 32: T1 · axial · 3.0mm · 1.15mm/px · 1 of 28 slices shown (23 of 40)]
[im 1/28]
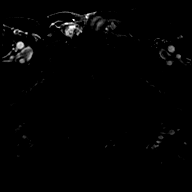

[Series 33: T1 · axial · 3.0mm · 1.15mm/px · 1 of 28 slices shown (24 of 40)]
[im 1/28]
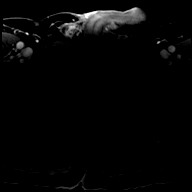

[Series 34: T1 · axial · 3.0mm · 1.15mm/px · 1 of 26 slices shown (25 of 40)]
[im 1/26]
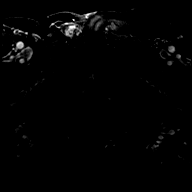

[Series 35: T1 · axial · 3.0mm · 1.15mm/px · 1 of 28 slices shown (26 of 40)]
[im 1/28]
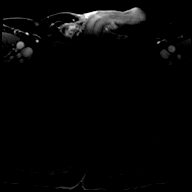

[Series 36: T1 · axial · 3.0mm · 1.15mm/px · 1 of 28 slices shown (27 of 40)]
[im 1/28]
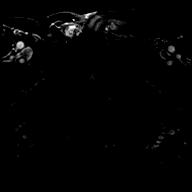

[Series 37: T1 · axial · 3.0mm · 1.15mm/px · 1 of 28 slices shown (28 of 40)]
[im 1/28]
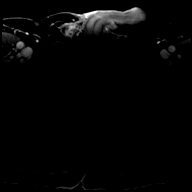

[Series 38: T1 · axial · 3.0mm · 1.15mm/px · 1 of 28 slices shown (29 of 40)]
[im 1/28]
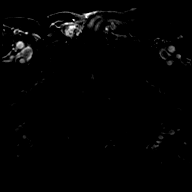

[Series 39: T1 · axial · 3.0mm · 1.15mm/px · 1 of 28 slices shown (30 of 40)]
[im 1/28]
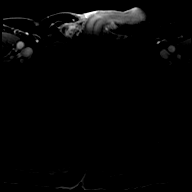

[Series 41: T1 · axial · 3.0mm · 1.15mm/px · 1 of 28 slices shown (31 of 40)]
[im 1/28]
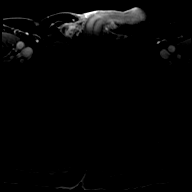

[Series 42: T1 · axial · 3.0mm · 1.15mm/px · 1 of 28 slices shown (32 of 40)]
[im 1/28]
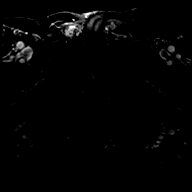

[Series 43: T1 · axial · 3.0mm · 1.15mm/px · 1 of 28 slices shown (33 of 40)]
[im 1/28]
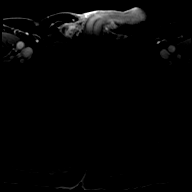

[Series 44: T1 · axial · 3.0mm · 1.15mm/px · 1 of 28 slices shown (34 of 40)]
[im 1/28]
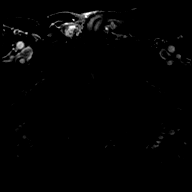

[Series 45: T1 · axial · 3.0mm · 1.15mm/px · 1 of 28 slices shown (35 of 40)]
[im 1/28]
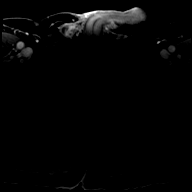

[Series 46: T1 · axial · 3.0mm · 1.15mm/px · 1 of 28 slices shown (36 of 40)]
[im 1/28]
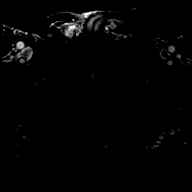

[Series 47: T1 · axial · 3.0mm · 1.15mm/px · 1 of 28 slices shown (37 of 40)]
[im 1/28]
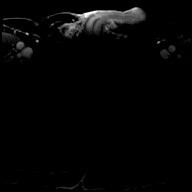

[Series 48: T1 · axial · 3.0mm · 1.15mm/px · 1 of 28 slices shown (38 of 40)]
[im 1/28]
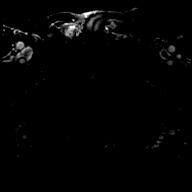

[Series 49: T1 · axial · 3.0mm · 1.15mm/px · 1 of 28 slices shown (39 of 40)]
[im 1/28]
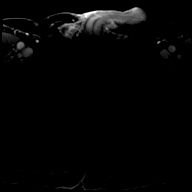

[Series 50: T1 · axial · 3.0mm · 1.15mm/px · 1 of 28 slices shown (40 of 40)]
[im 1/28]
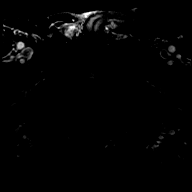

[48 of 49 positions shown; findings below may reference images not displayed]

FINDINGS: Prostate:

Encapsulated nodularity in the transition zone compatible with
benign prostatic hypertrophy. There is substantial scarring in the
right peripheral zone causing obscuration of borders between the
peripheral zone and transition zone in some areas on the right.

Region of interest # 1: PI-RADS category 4 lesion of the left
anterior transition zone and left anterior peripheral zone at the
apex, with focally reduced T2 signal in both (as on image 53 of
series 9 and image 57 of series 9) as well as some focal restricted
diffusion in this region (image 18, series 8). This measures 0.78 cc
(1.4 by 0.8 by 1.0 cm).

Region of interest # 2: PI-RADS category 4 lesion of the right
posteromedial peripheral zone in the mid gland, with focally reduced
T2 signal (image 46, series 9) with focally restricted diffusion
(image 15, series 8). This measures 0.31 cc (0.9 by 0.4 by 0.9 cm).

Region of interest # 3: PI-RADS category 3 lesion of the right
posterolateral peripheral zone in the mid gland, with focally
reduced T2 signal (image 18, series 4) but no substantial early
enhancement or restricted diffusion. This measures 0.38 cc (1.3 by
0.9 by 0.7 cm).

Region of interest # 4: PI-RADS category 3 lesion of the right
anterior peripheral zone in the mid gland, with mostly well-defined
focal low T2 signal (image 38, series 9) along with some restriction
of diffusion (image 13, series 8). This measures 0.30 cc (1.2 by
by 0.6 cm).

Volume: 3D volumetric assessment: Prostate volume 57.27 cc (5.6 by
4.9 by 4.9 cm).

Transcapsular spread:  Absent

Seminal vesicle involvement: Absent

Neurovascular bundle involvement: Absent

Pelvic adenopathy: Absent

Bone metastasis: Absent

Other findings: Right scrotal hydrocele.  Left iliopsoas bursitis.
IMPRESSION: 1. Two PI-RADS category 4 lesions and 2 PI-RADS category 3 lesions
in the prostate gland are identified. Targeting data sent to [facility].
2. Prostatomegaly and benign prostatic hypertrophy.
3. Scattered scarring in the prostate gland, particularly on the
right side, with some distortion and ill definition of the border
between the transition zone and the peripheral zone.
4. Right scrotal hydrocele.
5. Left iliopsoas bursitis.

## 2023-06-03 ENCOUNTER — Ambulatory Visit: Admission: RE | Admit: 2023-06-03 | Payer: BC Managed Care – PPO | Source: Ambulatory Visit

## 2023-06-28 ENCOUNTER — Ambulatory Visit
Admission: RE | Admit: 2023-06-28 | Discharge: 2023-06-28 | Disposition: A | Discharge status: Home / Self Care | Payer: BC Managed Care – PPO | Source: Ambulatory Visit | Attending: Urology | Admitting: Urology

## 2023-06-28 DIAGNOSIS — R972 Elevated prostate specific antigen [PSA]: Secondary | ICD-10-CM | POA: Insufficient documentation

## 2023-06-28 DIAGNOSIS — C61 Malignant neoplasm of prostate: Secondary | ICD-10-CM | POA: Insufficient documentation

## 2023-06-28 MED ORDER — GADOBUTROL 1 MMOL/ML IV SOLN
8.0000 mL | Freq: Once | INTRAVENOUS | Status: AC | PRN
  Administered 2023-06-28: 8 mL via INTRAVENOUS

## 2023-08-07 ENCOUNTER — Other Ambulatory Visit: Payer: BC Managed Care – PPO | Admitting: Urology

## 2023-10-08 ENCOUNTER — Other Ambulatory Visit: Payer: Self-pay

## 2023-10-08 ENCOUNTER — Other Ambulatory Visit

## 2023-10-08 DIAGNOSIS — R972 Elevated prostate specific antigen [PSA]: Secondary | ICD-10-CM

## 2023-10-09 ENCOUNTER — Ambulatory Visit: Payer: Self-pay | Admitting: Urology

## 2023-10-09 LAB — PSA: Prostate Specific Ag, Serum: 7 ng/mL — ABNORMAL HIGH (ref 0.0–4.0)

## 2023-11-04 DIAGNOSIS — R972 Elevated prostate specific antigen [PSA]: Secondary | ICD-10-CM | POA: Diagnosis not present

## 2023-11-04 DIAGNOSIS — Z2989 Encounter for other specified prophylactic measures: Secondary | ICD-10-CM | POA: Diagnosis not present

## 2023-11-04 DIAGNOSIS — C61 Malignant neoplasm of prostate: Secondary | ICD-10-CM | POA: Diagnosis not present

## 2023-11-28 ENCOUNTER — Encounter: Payer: Self-pay | Admitting: *Deleted

## 2023-12-03 ENCOUNTER — Telehealth: Admitting: Physician Assistant

## 2023-12-03 DIAGNOSIS — J208 Acute bronchitis due to other specified organisms: Secondary | ICD-10-CM | POA: Diagnosis not present

## 2023-12-03 MED ORDER — PREDNISONE 10 MG (21) PO TBPK
ORAL_TABLET | ORAL | 0 refills | Status: DC
Start: 2023-12-03 — End: 2023-12-17

## 2023-12-03 MED ORDER — BENZONATATE 100 MG PO CAPS
100.0000 mg | ORAL_CAPSULE | Freq: Three times a day (TID) | ORAL | 0 refills | Status: DC | PRN
Start: 2023-12-03 — End: 2023-12-17

## 2023-12-03 NOTE — Progress Notes (Signed)
 Virtual Visit Consent   Zachary Marshall, you are scheduled for a virtual visit with a [facility] provider today. Just as with appointments in the office, your consent must be obtained to participate. Your consent will be active for this visit and any virtual visit you may have with one of our providers in the next 365 days. If you have a MyChart account, a copy of this consent can be sent to you electronically.  As this is a virtual visit, video technology does not allow for your provider to perform a traditional examination. This may limit your provider's ability to fully assess your condition. If your provider identifies any concerns that need to be evaluated in person or the need to arrange testing (such as labs, EKG, etc.), we will make arrangements to do so. Although advances in technology are sophisticated, we cannot ensure that it will always work on either your end or our end. If the connection with a video visit is poor, the visit may have to be switched to a telephone visit. With either a video or telephone visit, we are not always able to ensure that we have a secure connection.  By engaging in this virtual visit, you consent to the provision of healthcare and authorize for your insurance to be billed (if applicable) for the services provided during this visit. Depending on your insurance coverage, you may receive a charge related to this service.  I need to obtain your verbal consent now. Are you willing to proceed with your visit today? Zachary Marshall has provided verbal consent on 12/03/2023 for a virtual visit (video or telephone). Zachary Marshall, [place]  Date: 12/03/2023 3:09 PM   Virtual Visit via Video Note   I, Zachary Marshall, connected with  Zachary Marshall  ([id number], 26-Nov-1955) on 12/03/23 at  3:00 PM EDT by a video-enabled telemedicine application and verified that I am speaking with the correct person using two identifiers.  Location: Patient: Virtual Visit Location  Patient: Home Provider: Virtual Visit Location Provider: Home Office   I discussed the limitations of evaluation and management by telemedicine and the availability of in person appointments. The patient expressed understanding and agreed to proceed.    History of Present Illness: Zachary Marshall is a 68 y.o. who identifies as a male who was assigned male at birth, and is being seen today for URI symptoms including chest congestion with cough that has become productive over the past 5-6 days. Notes coughing is worse at nighttime and affecting sleep. Denies fever, chills. Some mild nasal congestion but nothing substantial per patient. Denies chest pain or SOB. Cough is clear to yellow in color.  OTC -- Robitussin, Theraflu.   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    Allergies: No Known Allergies Medications:  Current Outpatient Medications:    benzonatate  (TESSALON ) 100 MG capsule, Take 1 capsule (100 mg total) by mouth 3 (three) times daily as needed for cough., Disp: 30 capsule, Rfl: 0   predniSONE  (STERAPRED UNI-PAK 21 TAB) 10 MG (21) TBPK tablet, Take following package directions, Disp: 21 tablet, Rfl: 0   Ascorbic Acid (VITAMIN C PO), Take 1 tablet by mouth daily., Disp: , Rfl:    Cholecalciferol (VITAMIN D3 PO), Take 1 tablet by mouth daily., Disp: , Rfl:    Multiple Vitamin (MULTIVITAMIN WITH MINERALS) TABS tablet, Take 1 tablet by mouth daily., Disp: , Rfl:  naproxen sodium (ALEVE) 220 MG tablet, Take 440 mg by mouth daily as needed (Arthritis)., Disp: , Rfl:    simvastatin  (ZOCOR ) 20 MG tablet, Take 20 mg by mouth every morning., Disp: , Rfl:    tamsulosin  (FLOMAX ) 0.4 MG CAPS capsule, Take 0.4 mg by mouth daily after breakfast., Disp: , Rfl:    traMADol  (ULTRAM ) 50 MG tablet, Take 50 mg by mouth every 6 (six) hours as needed (arthritis)., Disp: , Rfl:   Observations/Objective: Patient is  well-developed, well-nourished in no acute distress.  Resting comfortably  at home.  Head is normocephalic, atraumatic.  No labored breathing.  Speech is clear and coherent with logical content.  Patient is alert and oriented at baseline.   Assessment and Plan: 1. Acute viral bronchitis (Primary) - benzonatate  (TESSALON ) 100 MG capsule; Take 1 capsule (100 mg total) by mouth 3 (three) times daily as needed for cough.  Dispense: 30 capsule; Refill: 0 - predniSONE  (STERAPRED UNI-PAK 21 TAB) 10 MG (21) TBPK tablet; Take following package directions  Dispense: 21 tablet; Refill: 0  Supportive measures and OTC medications reviewed. Start Tessalon  and Sterapred per orders. Follow-up for any non-resolving, new or worsening symptoms despite treatment.  Follow Up Instructions: I discussed the assessment and treatment plan with the patient. The patient was provided an opportunity to ask questions and all were answered. The patient agreed with the plan and demonstrated an understanding of the instructions.  A copy of instructions were sent to the patient via MyChart unless otherwise noted below.   The patient was advised to call back or seek an in-person evaluation if the symptoms worsen or if the condition fails to improve as anticipated.    Zachary Velma Lunger, PA-C

## 2023-12-03 NOTE — Patient Instructions (Addendum)
  Zachary Marshall, thank you for joining Elsie Velma Lunger, PA-C for today's virtual visit.  While this provider is not your primary care provider (PCP), if your PCP is located in our provider database this encounter information will be shared with them immediately following your visit.   A [facility] MyChart account gives you access to today's visit and all your visits, tests, and labs performed at [facility]  click here if you don't have a [facility] MyChart account or go to [email or web address]  Consent: (Patient) Zachary Marshall provided verbal consent for this virtual visit at the beginning of the encounter.  Current Medications:  Current Outpatient Medications:    acetaminophen  (TYLENOL ) 500 MG tablet, Take 1,000 mg by mouth every 6 (six) hours as needed., Disp: , Rfl:    Ascorbic Acid (VITAMIN C PO), Take 1 tablet by mouth daily., Disp: , Rfl:    Cholecalciferol (VITAMIN D3 PO), Take 1 tablet by mouth daily., Disp: , Rfl:    doxylamine , Sleep, (UNISOM ) 25 MG tablet, Take 1 tablet (25 mg total) by mouth at bedtime as needed for up to 20 days., Disp: 20 tablet, Rfl: 0   levofloxacin  (LEVAQUIN ) 500 MG tablet, Take 1 tablet (500 mg total) by mouth daily., Disp: 14 tablet, Rfl: 0   Multiple Vitamin (MULTIVITAMIN WITH MINERALS) TABS tablet, Take 1 tablet by mouth daily., Disp: , Rfl:    naproxen sodium (ALEVE) 220 MG tablet, Take 440 mg by mouth daily as needed (Arthritis)., Disp: , Rfl:    polyethylene glycol (MIRALAX ) 17 g packet, Take 17 g by mouth daily., Disp: 14 each, Rfl: 0   simvastatin  (ZOCOR ) 20 MG tablet, Take 20 mg by mouth every morning., Disp: , Rfl:    tamsulosin  (FLOMAX ) 0.4 MG CAPS capsule, Take 0.4 mg by mouth daily after breakfast., Disp: , Rfl:    traMADol  (ULTRAM ) 50 MG tablet, Take 50 mg by mouth every 6 (six) hours as needed (arthritis)., Disp: , Rfl:    Medications ordered in this encounter:  No orders of the defined types were placed in this  encounter.    *If you need refills on other medications prior to your next appointment, please contact your pharmacy*  Follow-Up: Call back or seek an in-person evaluation if the symptoms worsen or if the condition fails to improve as anticipated.  [facility] [phone]  Other Instructions Please keep hydrated and rest. Start a saline nasal rinse.  Ok to continue your OTC medications. Start the Tessalon  for cough. Start the [employer] as discussed. If you note any non-resolving, new, or worsening symptoms despite treatment, please seek an in-person evaluation ASAP.     If you have been instructed to have an in-person evaluation today at a local Urgent Care facility, please use the link below. It will take you to a list of all of our available [facility], including address, phone number and hours of operation. Please do not delay care.  [facility]  If you or a family member do not have a primary care provider, use the link below to schedule a visit and establish care. When you choose a [facility] primary care physician or advanced practice provider, you gain a long-term partner in health. Find a Primary Care Provider  Learn more about [facility] in-office and virtual care options: [facility] - Get Care Now

## 2023-12-04 ENCOUNTER — Ambulatory Visit: Admitting: Urology

## 2023-12-04 VITALS — BP 164/81 | HR 86

## 2023-12-04 DIAGNOSIS — R972 Elevated prostate specific antigen [PSA]: Secondary | ICD-10-CM | POA: Diagnosis not present

## 2023-12-04 DIAGNOSIS — Z2989 Encounter for other specified prophylactic measures: Secondary | ICD-10-CM

## 2023-12-04 DIAGNOSIS — C61 Malignant neoplasm of prostate: Secondary | ICD-10-CM | POA: Diagnosis not present

## 2023-12-04 MED ORDER — GENTAMICIN SULFATE 40 MG/ML IJ SOLN
80.0000 mg | Freq: Once | INTRAMUSCULAR | Status: AC
Start: 2023-12-04 — End: 2023-12-04
  Administered 2023-12-04: 80 mg via INTRAMUSCULAR

## 2023-12-04 MED ORDER — LEVOFLOXACIN 500 MG PO TABS
500.0000 mg | ORAL_TABLET | Freq: Once | ORAL | Status: AC
Start: 2023-12-04 — End: 2023-12-04
  Administered 2023-12-04: 500 mg via ORAL

## 2023-12-04 NOTE — Progress Notes (Signed)
   12/04/23  Indication: Low risk prostate cancer on active surveillance  MRI Fusion Prostate Biopsy Procedure   Informed consent was obtained, and we discussed the risks of bleeding and infection/sepsis. A time out was performed to ensure correct patient identity.  Pre-Procedure: - Last PSA Level: 7 - Gentamicin  and levaquin  given for antibiotic prophylaxis - Prostate measured 55 g on MRI, PSA density 0.13 - ROI's hypoechoic  Procedure: - Prostate block performed using 10 cc 1% lidocaine   - MRI fusion biopsy was performed :  ROI#1 PIRADS4 (3) ROI#2 PIRADS4 (3) ROI#3 PIRADS3 (3)  - Total of 9 cores taken  Post-Procedure: - Patient tolerated the procedure well - He was counseled to seek immediate medical attention if experiences significant bleeding, fevers, or severe pain - Return in one week to discuss biopsy results  Assessment/ Plan: Will follow up in 1-2 weeks to discuss pathology with Dr. Twylla Redell Burnet, MD 12/04/2023

## 2023-12-04 NOTE — Patient Instructions (Signed)

## 2023-12-17 ENCOUNTER — Ambulatory Visit (INDEPENDENT_AMBULATORY_CARE_PROVIDER_SITE_OTHER)

## 2023-12-17 ENCOUNTER — Ambulatory Visit
Admission: RE | Admit: 2023-12-17 | Discharge: 2023-12-17 | Disposition: A | Discharge status: Home / Self Care | Source: Ambulatory Visit | Attending: Emergency Medicine | Admitting: Emergency Medicine

## 2023-12-17 ENCOUNTER — Ambulatory Visit: Payer: Self-pay | Admitting: Emergency Medicine

## 2023-12-17 VITALS — BP 131/80 | HR 74 | Temp 97.5°F | Resp 16

## 2023-12-17 DIAGNOSIS — J22 Unspecified acute lower respiratory infection: Secondary | ICD-10-CM

## 2023-12-17 DIAGNOSIS — R059 Cough, unspecified: Secondary | ICD-10-CM | POA: Diagnosis not present

## 2023-12-17 DIAGNOSIS — Z96611 Presence of right artificial shoulder joint: Secondary | ICD-10-CM | POA: Diagnosis not present

## 2023-12-17 DIAGNOSIS — Z96612 Presence of left artificial shoulder joint: Secondary | ICD-10-CM | POA: Diagnosis not present

## 2023-12-17 DIAGNOSIS — R0609 Other forms of dyspnea: Secondary | ICD-10-CM | POA: Diagnosis not present

## 2023-12-17 DIAGNOSIS — R051 Acute cough: Secondary | ICD-10-CM | POA: Diagnosis not present

## 2023-12-17 DIAGNOSIS — J0140 Acute pansinusitis, unspecified: Secondary | ICD-10-CM

## 2023-12-17 MED ORDER — HYDROCOD POLI-CHLORPHE POLI ER 10-8 MG/5ML PO SUER: 5.0000 mL | Freq: Two times a day (BID) | ORAL | 0 refills | Status: AC | PRN

## 2023-12-17 MED ORDER — PREDNISONE 20 MG PO TABS: 40.0000 mg | ORAL_TABLET | Freq: Every day | ORAL | 0 refills | Status: AC

## 2023-12-17 MED ORDER — AEROCHAMBER MV MISC: 1 refills | Status: AC

## 2023-12-17 MED ORDER — AMOXICILLIN-POT CLAVULANATE 875-125 MG PO TABS: 1.0000 | ORAL_TABLET | Freq: Two times a day (BID) | ORAL | 0 refills | Status: AC

## 2023-12-17 MED ORDER — FLUTICASONE PROPIONATE 50 MCG/ACT NA SUSP: 2.0000 | Freq: Every day | NASAL | 0 refills | Status: AC

## 2023-12-17 MED ORDER — ALBUTEROL SULFATE HFA 108 (90 BASE) MCG/ACT IN AERS: 1.0000 | INHALATION_SPRAY | RESPIRATORY_TRACT | 0 refills | Status: AC | PRN

## 2023-12-17 NOTE — ED Triage Notes (Signed)
 Pt symptoms started 2 weeks ago with a cough and chest congestion. He had a tele-visit on 7/15 and was prescribed prednisone  and tessalon . He felt better for a few days, but his symptoms returned. He has a worsening productive cough.

## 2023-12-17 NOTE — Discharge Instructions (Signed)
 I did not appreciate any obvious pneumonia on your x-ray.  However, the formal radiology overread is pending.  Will contact you if the radiology overread differs from mine and we need to change management.  In the meantime, Start Mucinex-D to keep the mucous thin and to decongest you.   You may take 600 mg of motrin with 1000 mg of tylenol  up to 3-4 times a day as needed for pain. This is an effective combination for pain.  Use a [employer] sinus rinse with distilled water as often as you want to to reduce nasal congestion. Follow the directions on the box.  Flonase  for nasal congestion and postnasal drip.  Finish the steroids and the Augmentin , even if you feel better.  2 puffs from your albuterol  inhaler using your spacer every 4-6 hours as needed for coughing, shortness of breath.  Will help open up your lungs.  Sinex for the cough.  Go to www.goodrx.com to look up your medications. This will give you a list of where you can find your prescriptions at the most affordable prices. Or you can ask the pharmacist what the cash price is. This is frequently cheaper than going through insurance.

## 2023-12-17 NOTE — ED Provider Notes (Signed)
 HPI  SUBJECTIVE:  Zachary Marshall is a 68 y.o. male who presents with nasal congestion, clear/yellow rhinorrhea, sinus pain and pressure, postnasal drip, cough productive of clear yellow sputum, shortness of breath, dyspnea on heavy exertion starting 2 days ago.  No fevers, facial swelling, abdominal pain, chest pain, wheezing. He had an Evisit on 7/15 with 5 to 6 days of URI symptoms with nasal congestion, chest congestion, cough productive of clear to yellow sputum.  He was thought to have viral bronchitis and was sent home Tessalon  and prednisone  6-day taper.  States that he got completely better, but then got sick again 2 days ago.  He has also tried Mucinex, TheraFlu and honey.  The prednisone  helps.  Symptoms are worse at night and with lying down.  No antipyretic in the past 6 hours.  No GERD symptoms.  He has a past medical history of prostate cancer and had 1 dose of antibiotics prior to having a prostate biopsy.  No other antibiotics in the past 3 months.  He has a history of hyperlipidemia.  No history of pulmonary disease, smoking.  [facility].  His wife currently has identical symptoms and was thought to have a sinusitis.  She is currently on a Z-Pak and prednisone .   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   shoulder replacement,    PONV (postoperative nausea and vomiting)    x 1-after colonoscopy nauseated   Prostate cancer [facility])     Past Surgical History:  Procedure Laterality Date   APPENDECTOMY     COLONOSCOPY     HERNIA REPAIR Right    HYDROCELE EXCISION Right 07/31/2022   Procedure: HYDROCELECTOMY ADULT;  Surgeon: Twylla Glendia BROCKS, MD;  Location: [facility] ORS;  Service: Urology;  Laterality: Right;   PROSTATE BIOPSY N/A 08/17/2021   Procedure: PROSTATE BIOPSY  GRAYCE;  Surgeon: Kassie Ozell SAUNDERS, MD;  Location: [facility] ORS;  Service: Urology;  Laterality: N/A;   SHOULDER HEMI-ARTHROPLASTY Left 04/21/2013   Procedure: SHOULDER HEMI-ARTHROPLASTY, left;  Surgeon: Fonda SHAUNNA Olmsted, MD;  Location[facility] OR;  Service: Orthopedics;  Laterality: Left;   TONSILLECTOMY     as a child   TOTAL ANKLE ARTHROPLASTY Left 03/01/2020   at [facility]   TOTAL SHOULDER ARTHROPLASTY Right 05/05/2020   Procedure: RIGHT TOTAL SHOULDER ARTHROPLASTY;  Surgeon: Olmsted Fonda, MD;  Location: [facility];  Service: Orthopedics;  Laterality: Right;    Family History  Problem Relation Age of Onset   Lung cancer Father     Social History   Tobacco Use   Smoking status: Never   Smokeless tobacco: Never  Vaping Use   Vaping status: Never Used  Substance Use Topics   Alcohol use: Yes    Alcohol/week: 3.0 standard drinks of alcohol    Types: 3 Cans of beer per week    Comment: occasional beer   Drug use: No    No current facility-administered medications for this encounter.  Current Outpatient Medications:    albuterol  (VENTOLIN  HFA) 108 (90 Base) MCG/ACT inhaler, Inhale 1-2 puffs into the lungs every 4 (four) hours as needed for wheezing or shortness of breath., Disp: 1 each, Rfl: 0   amoxicillin -clavulanate (AUGMENTIN ) 875-125 MG tablet, Take 1 tablet by mouth every 12 (twelve) hours.,  Disp: 14 tablet, Rfl: 0   chlorpheniramine-HYDROcodone  (TUSSIONEX) 10-8 MG/5ML, Take 5 mLs by mouth every 12 (twelve) hours as needed for cough., Disp: 60 mL, Rfl: 0   fluticasone  (FLONASE ) 50 MCG/ACT nasal spray, Place 2 sprays into both nostrils daily., Disp: 16 g, Rfl: 0   predniSONE  (DELTASONE ) 20 MG tablet, Take 2 tablets (40 mg total) by mouth daily with breakfast for 5 days., Disp: 10 tablet, Rfl: 0   Spacer/Aero-Holding Chambers (AEROCHAMBER MV) inhaler, Use as instructed, Disp: 1 each, Rfl: 1   Ascorbic Acid (VITAMIN C PO), Take 1 tablet by mouth daily., Disp: , Rfl:     Cholecalciferol (VITAMIN D3 PO), Take 1 tablet by mouth daily., Disp: , Rfl:    Multiple Vitamin (MULTIVITAMIN WITH MINERALS) TABS tablet, Take 1 tablet by mouth daily., Disp: , Rfl:    naproxen sodium (ALEVE) 220 MG tablet, Take 440 mg by mouth daily as needed (Arthritis)., Disp: , Rfl:    simvastatin  (ZOCOR ) 20 MG tablet, Take 20 mg by mouth every morning., Disp: , Rfl:    tamsulosin  (FLOMAX ) 0.4 MG CAPS capsule, Take 0.4 mg by mouth daily after breakfast., Disp: , Rfl:    [Paused] traMADol  (ULTRAM ) 50 MG tablet, Take 50 mg by mouth every 6 (six) hours as needed (arthritis)., Disp: , Rfl:   No Known Allergies   ROS  As noted in HPI.   Physical Exam  BP 131/80 (BP Location: Left Arm)   Pulse 74   Temp (!) 97.5 F (36.4 C) (Oral)   Resp 16   SpO2 100%   Constitutional: Well developed, well nourished, no acute distress Eyes: PERRL, EOMI, conjunctiva normal bilaterally HENT: Normocephalic, atraumatic,mucus membranes moist Nasal congestion.  Erythematous, swollen turbinates.  No maxillary, frontal sinus tenderness.  No postnasal drip. Respiratory: Clear to auscultation bilaterally, no rales, no wheezing, no rhonchi.  No anterior, lateral chest wall tenderness Cardiovascular: Normal rate and rhythm, no murmurs, no gallops, no rubs GI: nondistended skin: No rash, skin intact Musculoskeletal: no deformities Neurologic: Alert & oriented x 3, CN III-XII grossly intact, no motor deficits, sensation grossly intact Psychiatric: Speech and behavior appropriate   ED Course   Medications - No data to display  Orders Placed This Encounter  Procedures   DG Chest 2 View    Standing Status:   Standing    Number of Occurrences:   1    Reason for Exam (SYMPTOM  OR DIAGNOSIS REQUIRED):   Cough for 2 weeks, dyspnea on exertion.  Rule out pneumonia   No results found for this or any previous visit (from the past 24 hours). DG Chest 2 View Result Date: 12/17/2023 CLINICAL DATA:  Cough for 2  weeks, dyspnea on exertion. EXAM: CHEST - 2 VIEW COMPARISON:  None Available. FINDINGS: The heart size and mediastinal contours are within normal limits. Both lungs are clear. Status post bilateral shoulder arthroplasties. IMPRESSION: No active cardiopulmonary disease. Electronically Signed   By: Lynwood Landy Raddle M.D.   On: 12/17/2023 10:47    ED Clinical Impression  1. Acute non-recurrent pansinusitis   2. Acute cough   3. Lower respiratory tract infection      ED Assessment/Plan     Outside records reviewed.  As noted in HPI.  Checking chest x-ray to rule out pneumonia.  I suspect this is persistent of the original infection.   [place] [facility] database reviewed for this patient, and feel that the risk/benefit ratio today is favorable for proceeding with a prescription for  controlled substance.  Patient has an ongoing prescription of tramadol  from his PCP, last refill was 11/18/2023.  Reviewed imaging independently.  No acute cardiopulmonary disease formal radiology overread pending.  Will contact patient [phone] if radiology overread differs enough from mine and we need to change management.  Reviewed radiology report.  No acute cardiopulmonary disease consistent with my read. See radiology report for full details.  Chest x-ray negative for pneumonia.  However given double sickening, will send home with 7 days of Augmentin  875 mg p.o. twice daily for sinusitis, which should also cover any possible pneumonia not showing up on x-ray.  Home with Flonase , Mucinex D, saline nasal irrigation, albuterol  inhaler with a spacer every 4-6 hours, prednisone  40 mg for 5 days.   Tussionex.  Will advise patient to not take tramadol  while taking Tussionex.  Discussed MDM, treatment plan, and plan for follow-up with patient Discussed sn/sx that should prompt return to the ED. patient agrees with plan.   Meds ordered this encounter  Medications   fluticasone  (FLONASE ) 50 MCG/ACT nasal spray    Sig:  Place 2 sprays into both nostrils daily.    Dispense:  16 g    Refill:  0   amoxicillin -clavulanate (AUGMENTIN ) 875-125 MG tablet    Sig: Take 1 tablet by mouth every 12 (twelve) hours.    Dispense:  14 tablet    Refill:  0   predniSONE  (DELTASONE ) 20 MG tablet    Sig: Take 2 tablets (40 mg total) by mouth daily with breakfast for 5 days.    Dispense:  10 tablet    Refill:  0   chlorpheniramine-HYDROcodone  (TUSSIONEX) 10-8 MG/5ML    Sig: Take 5 mLs by mouth every 12 (twelve) hours as needed for cough.    Dispense:  60 mL    Refill:  0   albuterol  (VENTOLIN  HFA) 108 (90 Base) MCG/ACT inhaler    Sig: Inhale 1-2 puffs into the lungs every 4 (four) hours as needed for wheezing or shortness of breath.    Dispense:  1 each    Refill:  0   Spacer/Aero-Holding Chambers (AEROCHAMBER MV) inhaler    Sig: Use as instructed    Dispense:  1 each    Refill:  1      *This clinic note was created using Dragon dictation software. Therefore, there may be occasional mistakes despite careful proofreading. ?    Van Knee, MD 12/20/23 1036

## 2023-12-19 ENCOUNTER — Encounter: Payer: Self-pay | Admitting: Urology

## 2023-12-19 ENCOUNTER — Ambulatory Visit: Admitting: Urology

## 2023-12-19 VITALS — BP 148/86 | HR 76 | Ht 71.0 in | Wt 165.0 lb

## 2023-12-19 DIAGNOSIS — C61 Malignant neoplasm of prostate: Secondary | ICD-10-CM | POA: Diagnosis not present

## 2023-12-19 NOTE — Progress Notes (Signed)
 12/19/2023 1:30 PM   POWELL HALBERT 14-Apr-1956 [id number]  Referring provider: Jeffie Cheryl BRAVO, MD [address] [place],  [place] [address]  Chief Complaint  Patient presents with   Results   Urologic history: 1.  Prostate cancer PSA 5.29 02/2021 MRI 07/24/2021: 57 cc gland; PI-RADS 4 lesions x 2; PI-RADS 3 lesions x 2 MR fusion biopsy 08/17/2021 3 cores each ROI + standard 12 core template biopsies Pathology: LB Gleason 3+3 (18%); LLB Gleason 3+3 (11%); ROI #2 1/3 Gleason 3+3 (3%)  2.  BPH with LUTS Tamsulosin /dutasteride  (started dutasteride  October 2023)  3.  Right hydrocele Has been treated with periodic aspiration   HPI: 68 y.o. male presents for prostate biopsy follow up.  Confirmatory prostate biopsy on 08/27/2022 at [facility]: prostate volume is 41 cc, PSA density 0.009 with pathology showing atypical small acinar proliferation right midapical core. All other biopsy core showed benign prostate tissue. Pre-biopsy prostate MRI 08/14/2022 showed no lesion suspicious for high grade prostate cancer. Follow-up MRI 06/2023; PSA bump 6.4; PI-RADS 4 lesions x 2 right PZ and left anterior TZ; PI-RADS 3 lesion right PZ.  Prostate volume 54.9 cc Follow-up MR fusion biopsy 12/04/2023.  3 cores each ROI performed.  No template biopsy No postbiopsy complaints Pathology: The PI-RADS 4 right PZ lesion showed Gleason 3+4 adenocarcinoma involving 24% of the submitted tissue.  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   shoulder replacement,    PONV (postoperative nausea and vomiting)    x 1-after colonoscopy nauseated   Prostate cancer [facility])     Surgical History: Past Surgical History:  Procedure Laterality Date   APPENDECTOMY      COLONOSCOPY     HERNIA REPAIR Right    HYDROCELE EXCISION Right 07/31/2022   Procedure: HYDROCELECTOMY ADULT;  Surgeon: Twylla Glendia BROCKS, MD;  Location: [facility] ORS;  Service: Urology;  Laterality: Right;   PROSTATE BIOPSY N/A 08/17/2021   Procedure: PROSTATE BIOPSY GRAYCE;  Surgeon: Kassie Ozell SAUNDERS, MD;  Location: [facility] ORS;  Service: Urology;  Laterality: N/A;   SHOULDER HEMI-ARTHROPLASTY Left 04/21/2013   Procedure: SHOULDER HEMI-ARTHROPLASTY, left;  Surgeon: Fonda SHAUNNA Olmsted, MD;  Location: [facility] OR;  Service: Orthopedics;  Laterality: Left;   TONSILLECTOMY     as a child   TOTAL ANKLE ARTHROPLASTY Left 03/01/2020   at [facility]   TOTAL SHOULDER ARTHROPLASTY Right 05/05/2020   Procedure: RIGHT TOTAL SHOULDER ARTHROPLASTY;  Surgeon: Olmsted Fonda, MD;  Location: [facility];  Service: Orthopedics;  Laterality: Right;    Home Medications:  Allergies as of 12/19/2023   No Known Allergies      Medication List        Accurate as of December 19, 2023  1:30 PM. If you have any questions, ask your nurse or doctor.          PAUSE taking these medications    traMADol  50 MG tablet Wait to take this until: December 24, 2023 Commonly known as: ULTRAM  Take 50 mg by mouth every 6 (six) hours as needed (arthritis).       TAKE these medications    AeroChamber MV inhaler Use as instructed   albuterol  108 (90 Base) MCG/ACT inhaler Commonly known  as: VENTOLIN  HFA Inhale 1-2 puffs into the lungs every 4 (four) hours as needed for wheezing or shortness of breath.   amoxicillin -clavulanate 875-125 MG tablet Commonly known as: AUGMENTIN  Take 1 tablet by mouth every 12 (twelve) hours.   chlorpheniramine-HYDROcodone  10-8 MG/5ML Commonly known as: TUSSIONEX Take 5 mLs by mouth every 12 (twelve) hours as needed for cough.   fluticasone  50 MCG/ACT nasal spray Commonly known as: FLONASE  Place 2 sprays into both nostrils daily.   multivitamin with minerals Tabs tablet Take 1 tablet by  mouth daily.   naproxen sodium 220 MG tablet Commonly known as: ALEVE Take 440 mg by mouth daily as needed (Arthritis).   predniSONE  20 MG tablet Commonly known as: DELTASONE  Take 2 tablets (40 mg total) by mouth daily with breakfast for 5 days.   simvastatin  20 MG tablet Commonly known as: ZOCOR  Take 20 mg by mouth every morning.   tamsulosin  0.4 MG Caps capsule Commonly known as: FLOMAX  Take 0.4 mg by mouth daily after breakfast.   VITAMIN C PO Take 1 tablet by mouth daily.   VITAMIN D3 PO Take 1 tablet by mouth daily.        Family History: Family History  Problem Relation Age of Onset   Lung cancer Father     Social History:  reports that he has never smoked. He has never used smokeless tobacco. He reports current alcohol use of about 3.0 standard drinks of alcohol per week. He reports that he does not use drugs.   Physical Exam: BP (!) 148/86   Pulse 76   Ht 5' 11 (1.803 m)   Wt 165 lb (74.8 kg)   SpO2 99%   BMI 23.01 kg/m   Constitutional:  Alert and oriented, No acute distress.   Assessment & Plan:    1.  Prostate cancer Recent biopsy upgraded Gleason 3+4.  We discussed NCCN risk stratification as favorable intermediate Will send path for GPS We discussed curative treatment options of RALP and radiation modalities.  We discussed there is not considered a best treatment option for prostate cancer due to lack of randomized head-to-head studies and both are considered equivalent.  We discussed the higher risk of recurrence post radiation after 10-15 years. He would like to wait on his GPS results before making a decision   Glendia JAYSON Barba, MD  [facility] [address], [address] [place], [place] [address] [phone]

## 2024-01-07 ENCOUNTER — Encounter: Payer: Self-pay | Admitting: Urology

## 2024-01-17 ENCOUNTER — Other Ambulatory Visit: Payer: Self-pay | Admitting: Urology

## 2024-01-17 DIAGNOSIS — C61 Malignant neoplasm of prostate: Secondary | ICD-10-CM

## 2024-03-02 DIAGNOSIS — C61 Malignant neoplasm of prostate: Secondary | ICD-10-CM | POA: Diagnosis not present

## 2024-03-05 ENCOUNTER — Encounter: Payer: Self-pay | Admitting: Urology

## 2024-03-18 DIAGNOSIS — Z23 Encounter for immunization: Secondary | ICD-10-CM | POA: Diagnosis not present

## 2024-03-18 DIAGNOSIS — M129 Arthropathy, unspecified: Secondary | ICD-10-CM | POA: Diagnosis not present

## 2024-03-18 DIAGNOSIS — R7302 Impaired glucose tolerance (oral): Secondary | ICD-10-CM | POA: Diagnosis not present

## 2024-03-18 DIAGNOSIS — Z1211 Encounter for screening for malignant neoplasm of colon: Secondary | ICD-10-CM | POA: Diagnosis not present

## 2024-03-18 DIAGNOSIS — C61 Malignant neoplasm of prostate: Secondary | ICD-10-CM | POA: Diagnosis not present

## 2024-03-18 DIAGNOSIS — E7800 Pure hypercholesterolemia, unspecified: Secondary | ICD-10-CM | POA: Diagnosis not present

## 2024-03-18 DIAGNOSIS — N400 Enlarged prostate without lower urinary tract symptoms: Secondary | ICD-10-CM | POA: Diagnosis not present

## 2024-03-18 DIAGNOSIS — Z0000 Encounter for general adult medical examination without abnormal findings: Secondary | ICD-10-CM | POA: Diagnosis not present

## 2024-04-06 DIAGNOSIS — C61 Malignant neoplasm of prostate: Secondary | ICD-10-CM | POA: Diagnosis not present
